# Patient Record
Sex: Female | Born: 1985 | Race: White | Hispanic: No | Marital: Married | Smoking: Never smoker
Health system: Southern US, Community
[De-identification: ages and names within clinical notes are randomized; demographics above are authoritative.]

## PROBLEM LIST (undated history)

## (undated) ENCOUNTER — Inpatient Hospital Stay: Payer: Self-pay

## (undated) ENCOUNTER — Ambulatory Visit: Admission: EM | Payer: Self-pay | Source: Home / Self Care

## (undated) ENCOUNTER — Ambulatory Visit: Admission: EM | Payer: Self-pay

## (undated) DIAGNOSIS — E041 Nontoxic single thyroid nodule: Secondary | ICD-10-CM

## (undated) DIAGNOSIS — K30 Functional dyspepsia: Secondary | ICD-10-CM

## (undated) DIAGNOSIS — Z9889 Other specified postprocedural states: Secondary | ICD-10-CM

## (undated) DIAGNOSIS — T8859XA Other complications of anesthesia, initial encounter: Secondary | ICD-10-CM

## (undated) DIAGNOSIS — F419 Anxiety disorder, unspecified: Secondary | ICD-10-CM

## (undated) DIAGNOSIS — D649 Anemia, unspecified: Secondary | ICD-10-CM

## (undated) DIAGNOSIS — T4145XA Adverse effect of unspecified anesthetic, initial encounter: Secondary | ICD-10-CM

---

## 2008-05-10 ENCOUNTER — Emergency Department: Payer: Self-pay | Admitting: Internal Medicine

## 2010-05-23 ENCOUNTER — Emergency Department: Payer: Self-pay | Admitting: Emergency Medicine

## 2010-06-03 IMAGING — CR DG CHEST 2V
1 series · 2 of 2 positions shown · non-contrast
Comparison: none

REASON FOR EXAM: cp
COMMENTS:

PROCEDURE:     DXR - DXR CHEST PA (OR AP) AND LATERAL  - May 10, 2008 [DATE]
RESULT:     The lung fields are clear. The heart, mediastinal and osseous
structures show no significant abnormalities.

[Series 1: view not recorded · 0.17mm/px · 2 of 2 slices shown]
[im 1/2]
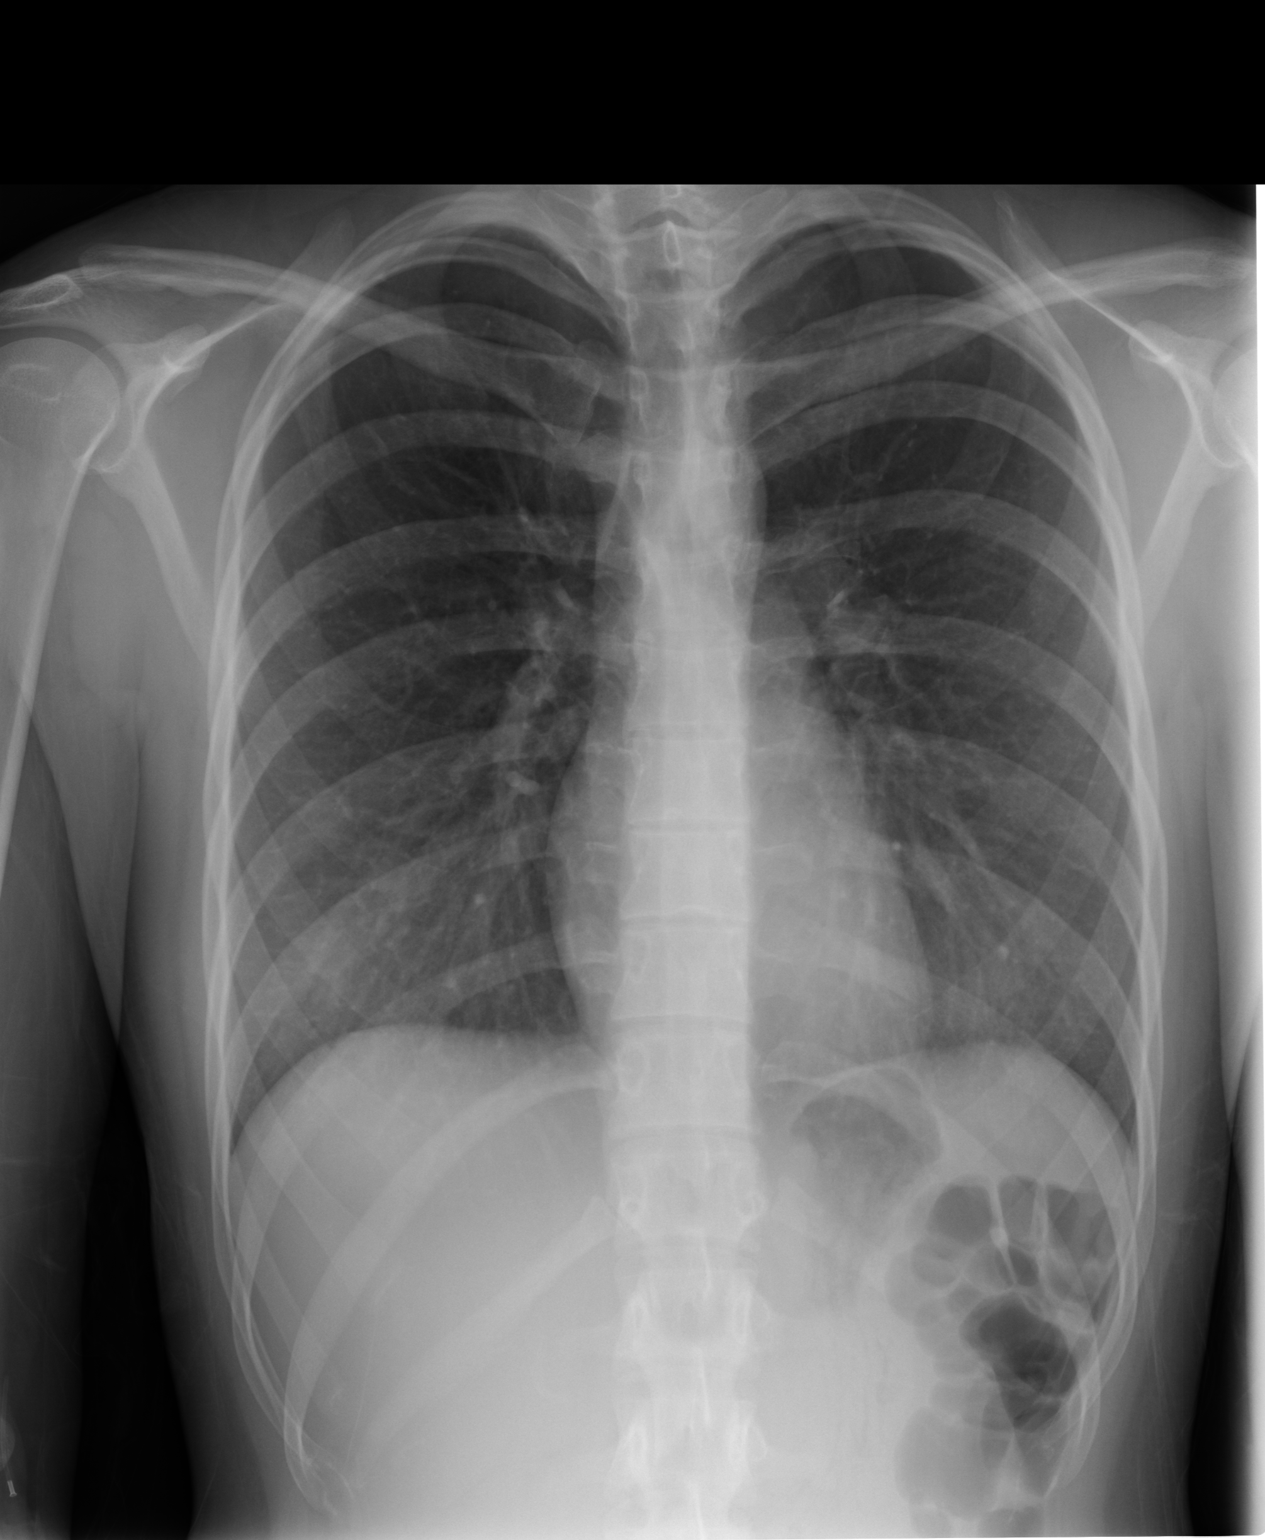
[im 2/2]
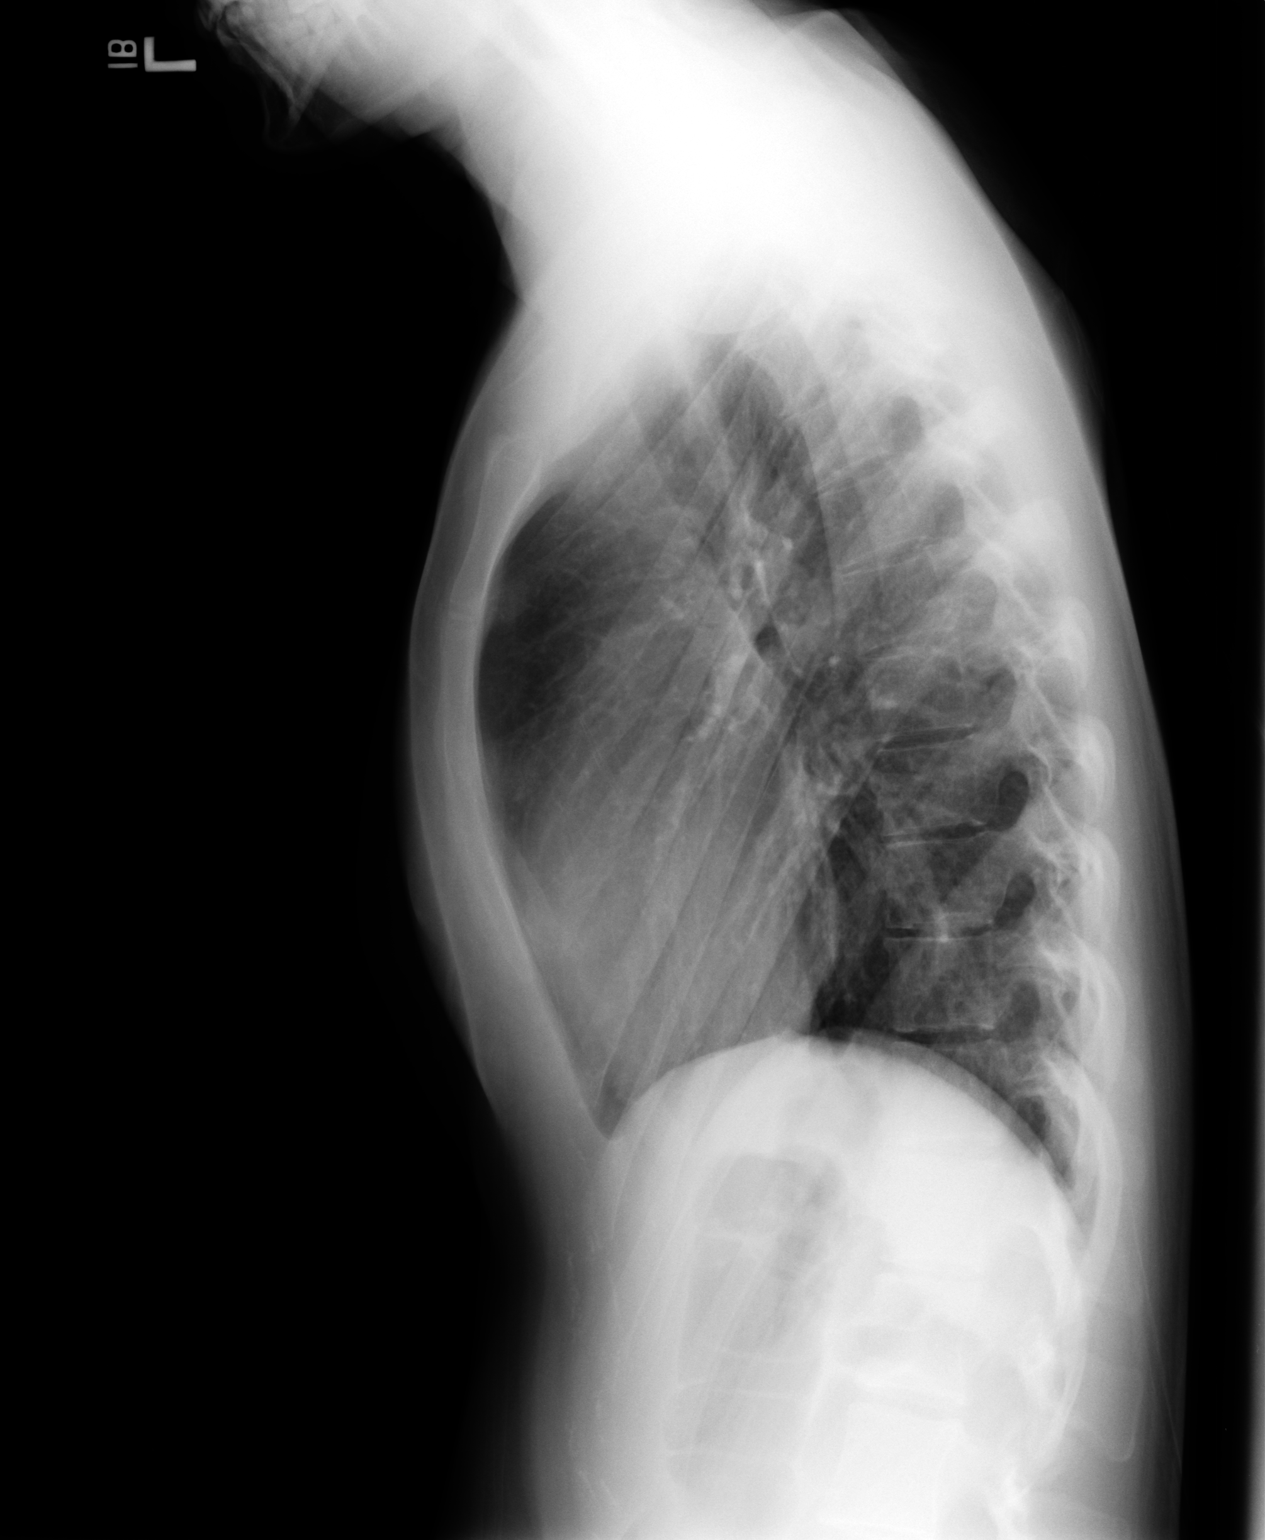

[2 of 2 positions shown; findings below may reference images not displayed]

IMPRESSION: 1.     No significant abnormalities are noted.

## 2010-11-30 ENCOUNTER — Inpatient Hospital Stay: Payer: Self-pay

## 2012-06-15 IMAGING — US US OB < 14 WEEKS
1 series · 16 of 16 positions shown · non-contrast
Comparison: none

REASON FOR EXAM: 13 week gestation with pelvic pain
COMMENTS:   LMP: > one month ago

[Series 1: us ob < 14 weeks · 16 of 16 slices shown]
[im 1/16]
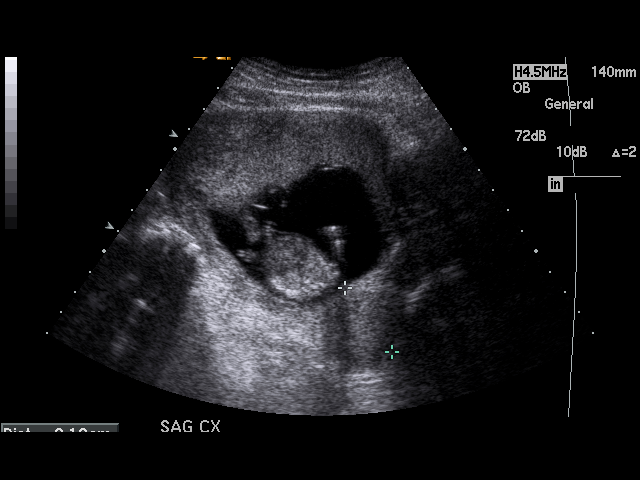
[im 2/16]
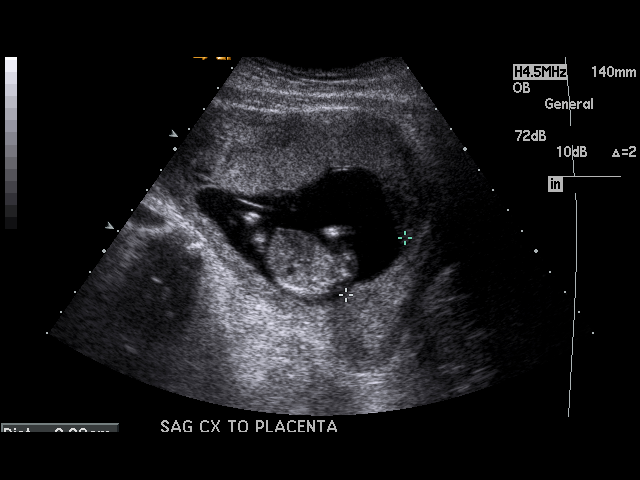
[im 3/16]
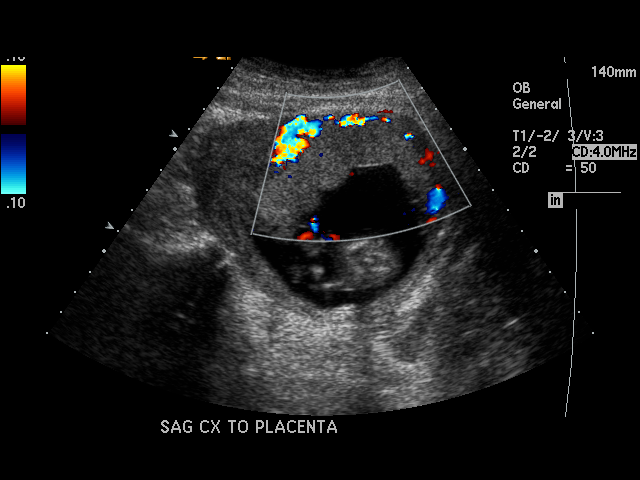
[im 4/16]
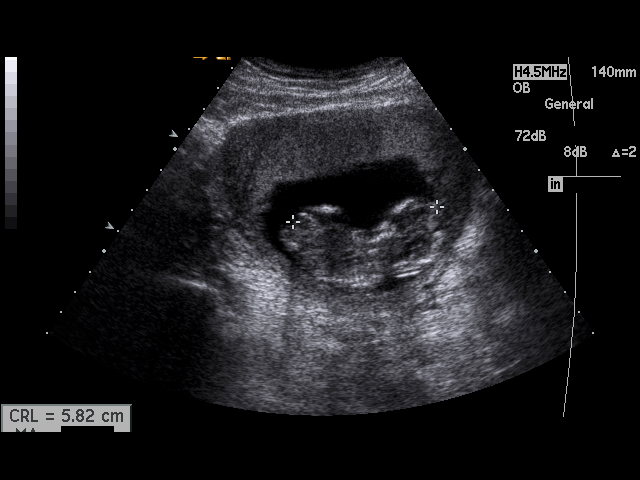
[im 5/16]
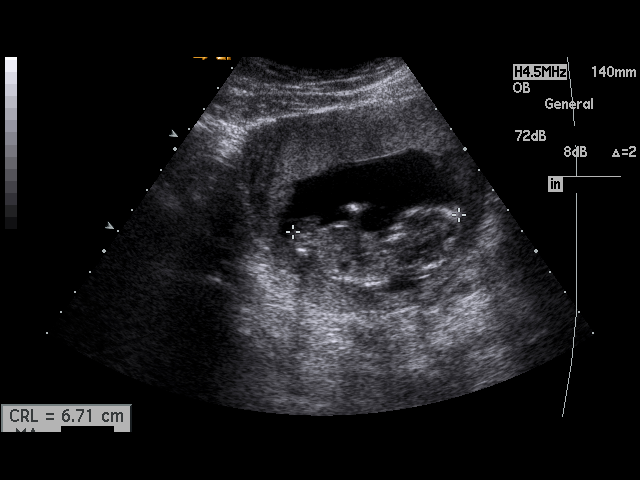
[im 6/16]
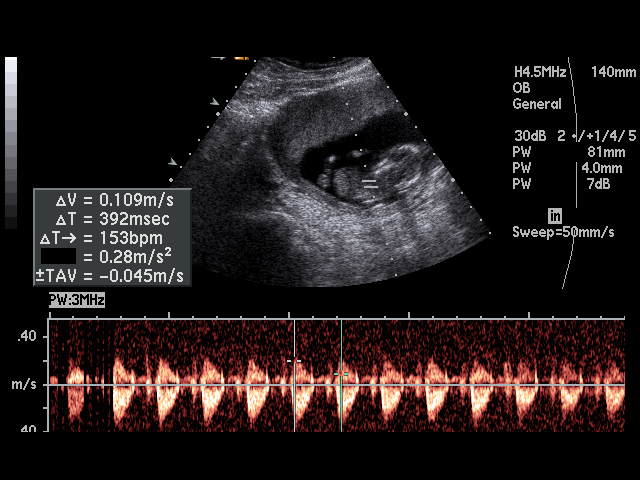
[im 7/16]
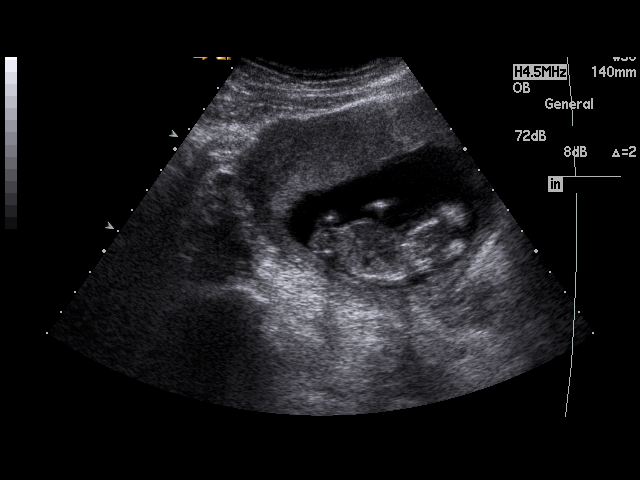
[im 8/16]
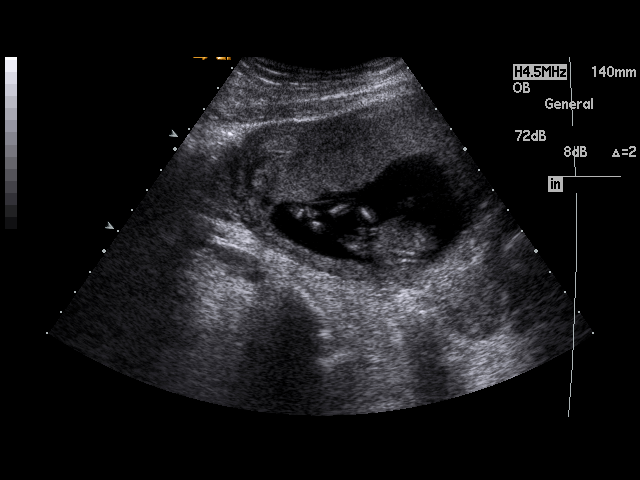
[im 9/16]
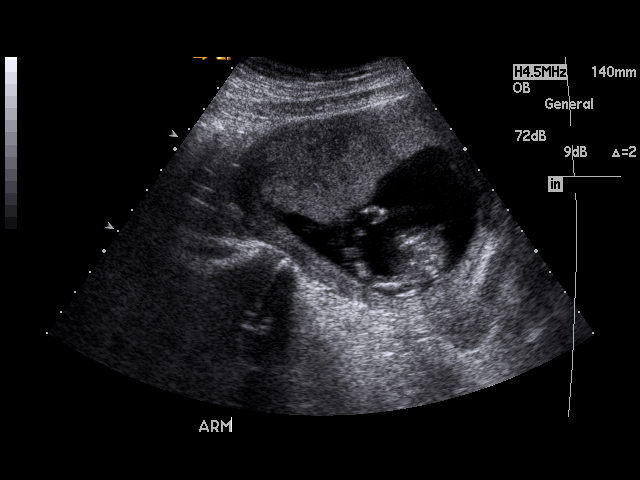
[im 10/16]
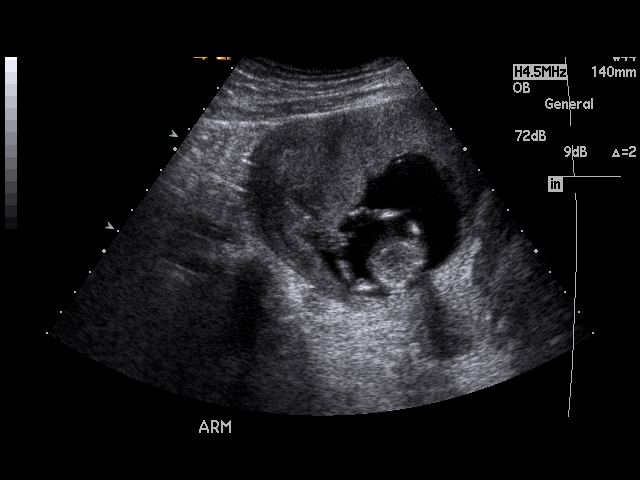
[im 11/16]
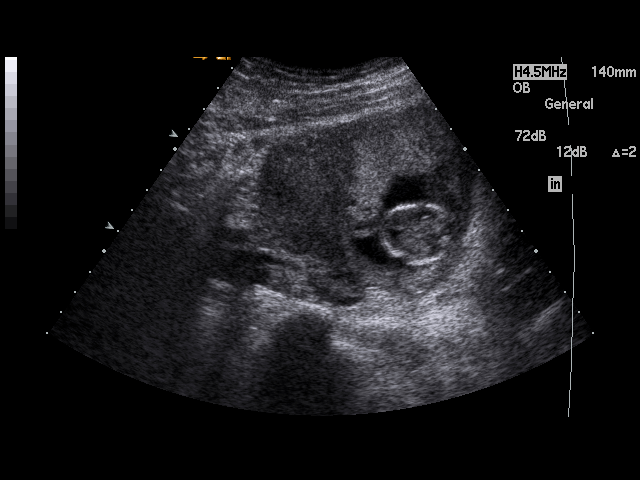
[im 12/16]
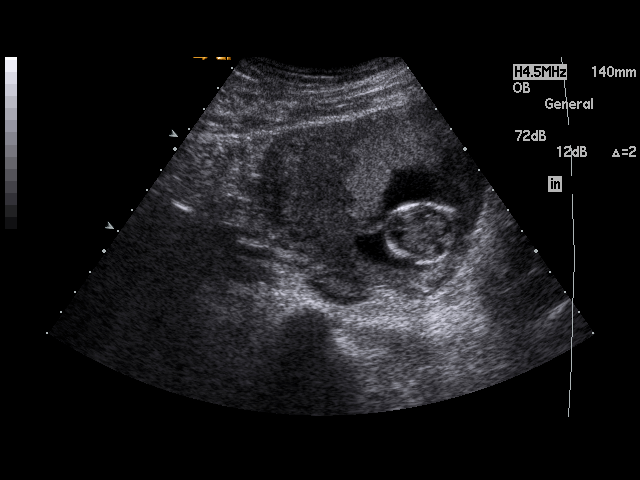
[im 13/16]
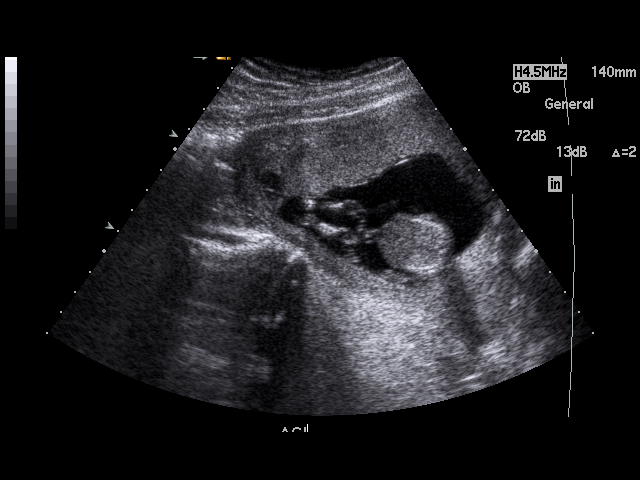
[im 14/16]
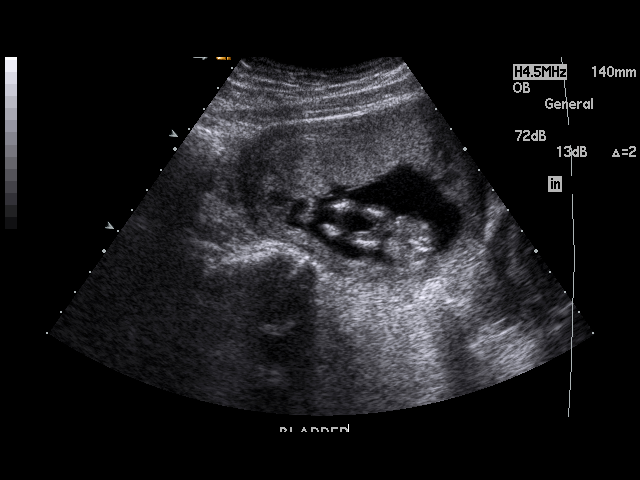
[im 15/16]
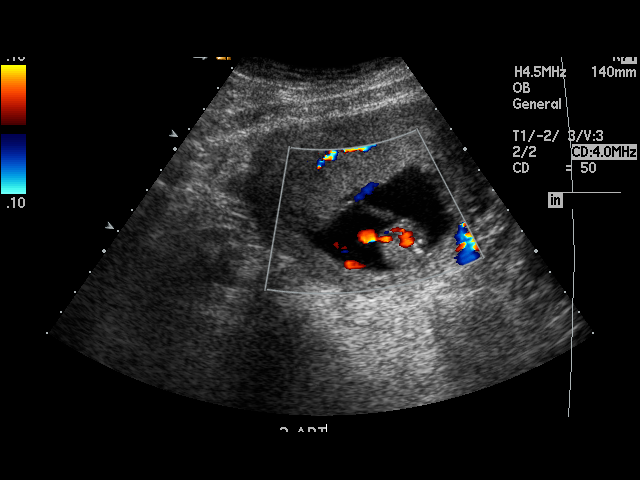
[im 16/16]
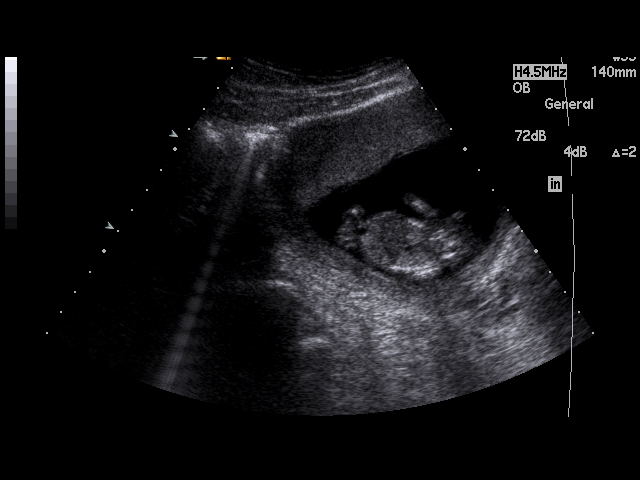

[16 of 16 positions shown; findings below may reference images not displayed]

PROCEDURE:     US  - US OB LESS THAN 14 WEEKS  - May 23, 2010  [DATE]

RESULT:     There is an IUP present. The crown-rump length measures 6.27 cm
corresponding to a 12 week 5 day gestation. Fetal cardiac activity with a
rate of 153 beats per minute is demonstrated. No subchorionic hemorrhage is
identified. The distance from the internal cervical os to the edge of the
placenta is approximately 3.3 cm. The cervix measures partially 3.2 cm in
length.
IMPRESSION: There is an early IUP which appears viable with a 12 week 5
day gestational age. The estimated date of confinement is 30 November, 2010. No
subchorionic hemorrhage is identified.

## 2014-08-15 LAB — OB RESULTS CONSOLE HEPATITIS B SURFACE ANTIGEN: HEP B S AG: NEGATIVE

## 2014-08-15 LAB — OB RESULTS CONSOLE RUBELLA ANTIBODY, IGM: Rubella: IMMUNE

## 2014-08-15 LAB — OB RESULTS CONSOLE VARICELLA ZOSTER ANTIBODY, IGG: Varicella: IMMUNE

## 2015-03-10 LAB — OB RESULTS CONSOLE GC/CHLAMYDIA
Chlamydia: NEGATIVE
Gonorrhea: NEGATIVE

## 2015-04-03 ENCOUNTER — Encounter
Admission: RE | Admit: 2015-04-03 | Discharge: 2015-04-03 | Disposition: A | Discharge status: Home / Self Care | Payer: Managed Care, Other (non HMO) | Source: Ambulatory Visit | Attending: Obstetrics and Gynecology | Admitting: Obstetrics and Gynecology

## 2015-04-03 HISTORY — DX: Anxiety disorder, unspecified: F41.9

## 2015-04-03 HISTORY — DX: Functional dyspepsia: K30

## 2015-04-03 HISTORY — DX: Adverse effect of unspecified anesthetic, initial encounter: T41.45XA

## 2015-04-03 HISTORY — DX: Other complications of anesthesia, initial encounter: T88.59XA

## 2015-04-03 LAB — DIFFERENTIAL
BASOS ABS: 0 10*3/uL (ref 0–0.1)
Basophils Relative: 0 %
EOS ABS: 0.2 10*3/uL (ref 0–0.7)
Eosinophils Relative: 2 %
LYMPHS ABS: 1.8 10*3/uL (ref 1.0–3.6)
Lymphocytes Relative: 22 %
Monocytes Absolute: 0.7 10*3/uL (ref 0.2–0.9)
Monocytes Relative: 8 %
NEUTROS ABS: 5.7 10*3/uL (ref 1.4–6.5)
NEUTROS PCT: 68 %

## 2015-04-03 LAB — TYPE AND SCREEN
ABO/RH(D): A POS
ANTIBODY SCREEN: NEGATIVE

## 2015-04-03 LAB — CBC
HCT: 36.1 % (ref 35.0–47.0)
HEMOGLOBIN: 11.8 g/dL — AB (ref 12.0–16.0)
MCH: 28.4 pg (ref 26.0–34.0)
MCHC: 32.8 g/dL (ref 32.0–36.0)
MCV: 86.5 fL (ref 80.0–100.0)
Platelets: 202 10*3/uL (ref 150–440)
RBC: 4.17 MIL/uL (ref 3.80–5.20)
RDW: 16.2 % — ABNORMAL HIGH (ref 11.5–14.5)
WBC: 8.4 10*3/uL (ref 3.6–11.0)

## 2015-04-03 LAB — ABO/RH: ABO/RH(D): A POS

## 2015-04-03 LAB — OB RESULTS CONSOLE HIV ANTIBODY (ROUTINE TESTING): HIV: NONREACTIVE

## 2015-04-03 NOTE — Patient Instructions (Signed)
Kimberly Hanna  Your procedure is scheduled on: 04/04/15 at 8:45 AM   Report to Emergency Room at 6:30 AM.             [facility]             [address]             [place], [place] [address]  Call this number if you have problems the morning of surgery: [phone]   Remember:   Do not eat food or drink liquids after midnight.     Do not wear jewelry, make-up or nail polish.  Do not wear lotions, powders, or perfumes. You may wear deodorant.  Do not shave prior to surgery.  Do not bring valuables to the hospital.  [facility] is not responsible for any belongings or valuables.                Contacts, dentures or bridgework may not be worn into surgery.  Leave suitcase in the car. After surgery it may be brought to your room.  For patients admitted to the hospital, discharge time is determined by your             treatment team.               Special Instructions: Preparing the skin before Cesarean Section              To help prevent the risk of infection at your surgical site, we are providing             you with rinse-free Sage 2% Chlorhexidine Gluconate (HCG) disposable             wipes.               The night before surgery:              1. Shower or bathe with warm water             2. Do not apply lotion or perfume             3. Wait one hour after shower, skin should be dry and cool             4. Open Sage wipe package - 2 disposable cloths are inside             5. Wipe the lower abdomen from the pubic line to the navel and hip bone to hip             bone with one cloth             6. Use the second cloth to wipe the front of the upper thighs             7. Allow the area to dry for one minute. DO NOT RINSE             8. Skin may feel "tacky" for several minutes             9. Dress in freshly laundered, clean clothes           10. Do not shower the morning of surgery    Please read over the following fact  sheets that you were given: Coughing and            Deep Breathing and Surgical Site Infection Prevention Cesarean Delivery  Cesarean delivery is the birth of a baby through a cut (incision) in the abdomen and womb (uterus).   LET [facility] CARE PROVIDER KNOW ABOUT:    All medicines you are taking, including vitamins, herbs, eye drops, creams, and over-the-counter medicines.  Previous problems you or members of your family have had with the use of anesthetics.  Any blood disorders you have.  Previous surgeries you have had.  Medical conditions you have.  Any allergies you have.  Complicationsinvolving the pregnancy.  RISKS AND COMPLICATIONS   Generally, this is a safe procedure. However, as with any procedure, complications can occur. Possible complications include:  Bleeding.  Infection.  Blood clots.  Injury to surrounding organs.  Problems with anesthesia.  Injury to the baby.  BEFORE THE PROCEDURE  1. You may be given an antacid medicine to drink. This will prevent acid contents in your stomach from going into your lungs if you vomit during the surgery. 2. You may be given an antibiotic medicine to prevent infection.  PROCEDURE   Hair may be removed from your pubic area and your lower abdomen. This is to prevent infection in the incision site.  A tube (Foley catheter) will be placed in your bladder to drain your urine from your bladder into a bag. This keeps your bladder empty during surgery.  An IV tube will be placed in your vein.  You may be given medicine to numb the lower half of your body (regional anesthetic). If you were in labor, you may have already had an epidural in place which can be used in both labor and cesarean delivery. You may possibly be given medicine to make you sleep (general anesthetic) though this is not as common.  An incision will be made in your abdomen that extends to your uterus. There are 2 basic kinds of incisions:  The  horizontal (transverse) incision. Horizontal incisions are from side to side and are used for most routine cesarean deliveries.  The vertical incision. The vertical incision is from the top of the abdomen to the bottom and is less commonly used. It is often done for women who have a serious complication (extreme prematurity) or under emergency situations.   Surgical Site Infections FAQs What is a Surgical Site Infection (SSI)? A surgical site infection is an infection that occurs after surgery in the part of the body where the surgery took place. Most patients who have surgery do not develop an infection. However, infections develop in about 1 to 3 out of every 100 patients who have surgery. Some of the common symptoms of a surgical site infection are:  Redness and pain around the area where you had surgery  Drainage of cloudy fluid from your surgical wound  Fever Can SSIs be treated? Yes. Most surgical site infections can be treated with antibiotics. The antibiotic given to you depends on the bacteria (germs) causing the infections. Sometimes patients with SSIs also need another surgery to treat the infection. What are some of the things that hospitals are doing to prevent SSIs? To prevent SSIs, doctors, nurses, and other healthcare providers:  Clean their hands and arms up to their elbows with an antiseptic agent just before the surgery.  Clean their hands with soap and water or an alcohol-based hand rub before and after caring for each patient.  May remove some of your hair immediately before your surgery using electric clippers if the hair is in the same area where the procedure will occur. They should not  shave you with a razor.  Wear special hair covers, masks, gowns, and gloves during surgery to keep the surgery area clean.  Give you antibiotics before your surgery starts. In most cases, you should get antibiotics within 60 minutes before the surgery starts and the antibiotics  should be stopped within 24 hours after surgery.  Clean the skin at the site of your surgery with a special soap that kills germs. What can I do to help prevent SSIs? Before your surgery: 3. Tell your doctor about other medical problems you may have. Health problems such as allergies, diabetes, and obesity could affect your surgery and your treatment. 4. Quit smoking. Patients who smoke get more infections. Talk to your doctor about how you can quit before your surgery. 5. Do not shave near where you will have surgery. Shaving with a razor can irritate your skin and make it easier to develop an infection. At the time of your surgery:  Speak up if someone tries to shave you with a razor before surgery. Ask why you need to be shaved and talk with your surgeon if you have any concerns.  Ask if you will get antibiotics before surgery. After your surgery:  Make sure that your healthcare providers clean their hands before examining you, either with soap and water or an alcohol-based hand rub.  If you do not see your providers clean their hands, please ask them to do so.  Family and friends who visit you should not touch the surgical wound or dressings.  Family and friends should clean their hands with soap and water or an alcohol-based hand rub before and after visiting you. If you do not see them clean their hands, ask them to clean their hands. What do I need to do when I go home from the hospital?  Before you go home, your doctor or nurse should explain everything you need to know about taking care of your wound. Make sure you understand how to care for your wound before you leave the hospital.  Always clean your hands before and after caring for your wound.  Before you go home, make sure you know who to contact if you have questions or problems after you get home.  If you have any symptoms of an infection, such as redness and pain at the surgery site, drainage, or fever, call your doctor  immediately. If you have additional questions, please ask your doctor or nurse. Developed and co-sponsored by [employer] for [employer] of [place] [phone]); Infectious Diseases Society of America (IDSA); [facility] Association; Association for Professionals in Infection Control and Epidemiology (APIC); Centers for Disease Control and Prevention (CDC); and [employer] Document Released: 06/15/2013 Document Reviewed: 06/15/2013 [facility] Patient Information 2015 [place], [place]. This information is not intended to replace advice given to you by your health care provider. Make sure you discuss any questions you have with your health care provider.   Incentive Spirometer An incentive spirometer is a tool that can help keep your lungs clear and active. This tool measures how well you are filling your lungs with each breath. Taking long, deep breaths may help reverse or decrease the chance of developing breathing (pulmonary) problems (especially infection) following:  Surgery of the chest or abdomen.  Surgery if you have a history of smoking or a lung problem.  A long period of time when you are unable to move or be active. BEFORE THE PROCEDURE   If the spirometer includes an indicator to show  your best effort, your nurse or respiratory therapist will set it to a desired goal.  If possible, sit up straight or lean slightly forward. Try not to slouch.  Hold the incentive spirometer in an upright position. INSTRUCTIONS FOR USE  6. Sit on the edge of your bed if possible, or sit up as far as you can in bed or on a chair. 7. Hold the incentive spirometer in an upright position. 8. Breathe out normally. 9. Place the mouthpiece in your mouth and seal your lips tightly around it. 10. Breathe in slowly and as deeply as possible, raising the piston or the ball toward the top of the column. 11. Hold your breath for 3-5 seconds or for as long as possible. Allow the piston or  ball to fall to the bottom of the column. 12. Remove the mouthpiece from your mouth and breathe out normally. 13. Rest for a few seconds and repeat Steps 1 through 7 at least 10 times every 1-2 hours when you are awake. Take your time and take a few normal breaths between deep breaths. 14. The spirometer may include an indicator to show your best effort. Use the indicator as a goal to work toward during each repetition. 15. After each set of 10 deep breaths, practice coughing to be sure your lungs are clear. If you have an incision (the cut made at the time of surgery), support your incision when coughing by placing a pillow or rolled-up towels firmly against it. Once you are able to get out of bed, walk around indoors and cough well. You may stop using the incentive spirometer when instructed by your caregiver.  RISKS AND COMPLICATIONS  Breathing too quickly may cause dizziness. At an extreme, this could cause you to pass out. Take your time so you do not get dizzy or light-headed.  If you are in pain, you may need to take or ask for pain medication before doing incentive spirometry. It is harder to take a deep breath if you are having pain. AFTER USE  Rest and breathe slowly and easily.  It can be helpful to keep a log of your progress. Your caregiver can provide you with a simple table to help with this. If you are using the spirometer at home, follow these instructions: SEEK MEDICAL CARE IF:   You are having difficultly using the spirometer.  You have trouble using the spirometer as often as instructed.  Your pain medication is not giving enough relief while using the spirometer.  You develop fever of 100.62F (38.1C) or higher. SEEK IMMEDIATE MEDICAL CARE IF:   You cough up bloody sputum that had not been present before.  You develop fever of 102F (38.9C) or greater.  You develop worsening pain at or near the incision site. MAKE SURE YOU:   Understand these  instructions.  Will watch your condition.  Will get help right away if you are not doing well or get worse. Document Released: 10/21/2006 Document Revised: 10/25/2013 Document Reviewed: 12/22/2006 [facility] Patient Information 2015 [place], [place]. This information is not intended to replace advice given to you by your health care provider. Make sure you discuss any questions you have with your health care provider.   The horizontal and vertical incisions may both be used at the same time. However, this is very uncommon.  An incision is then made in your uterus to deliver the baby.  Your baby will then be delivered.  Both incisions are then closed with absorbable stitches. AFTER  THE PROCEDURE   If you were awake during the surgery, you will see your baby right away. If you were asleep, you will see your baby as soon as you are awake.  You may breastfeed your baby after surgery.  You may be able to get up and walk the same day as the surgery. If you need to stay in bed for a period of time, you will receive help to turn, cough, and take deep breaths after surgery. This helps prevent lung problems such as pneumonia.  Do not get out of bed alone the first time after surgery. You will need help getting out of bed until you are able to do this by yourself.  You may be able to shower the day after your cesarean delivery. After the bandage (dressing) is taken off the incision site, a nurse will assist you to shower if you would like help.  You will have pneumatic compression hose placed on your lower legs. This is done to prevent blood clots. When you are up and walking regularly, they will no longer be necessary.  Do not cross your legs when you sit.  Save any blood clots that you pass. If you pass a clot while on the toilet, do not flush it. Call for the nurse. Tell the nurse if you think you are bleeding too much or passing too many clots.  You will be given medicine as needed. Let  your health care providers know if you are hurting. You may also be given an antibiotic to prevent an infection.  Your IV tube will be taken out when you are drinking a reasonable amount of fluids. The Foley catheter is taken out when you are up and walking.  If your blood type is Rh negative and your baby's blood type is Rh positive, you will be given a shot of anti-D immune globulin. This shot prevents you from having Rh problems with a future pregnancy. You should get the shot even if you had your tubes tied (tubal ligation).  If you are allowed to take the baby for a walk, place the baby in the bassinet and push it. Do not carry your baby in your arms. Document Released: 06/10/2005 Document Revised: 03/31/2013 Document Reviewed: 12/30/2012 [facility] Patient Information 2015 [place], [place]. This information is not intended to replace advice given to you by your health care provider. Make sure you discuss any questions you have with your health care provider.

## 2015-04-04 ENCOUNTER — Inpatient Hospital Stay: Payer: Managed Care, Other (non HMO) | Admitting: Anesthesiology

## 2015-04-04 ENCOUNTER — Encounter: Payer: Self-pay | Admitting: *Deleted

## 2015-04-04 ENCOUNTER — Encounter
Admission: RE | Disposition: A | Discharge status: Home / Self Care | Payer: Self-pay | Source: Ambulatory Visit | Attending: Obstetrics and Gynecology

## 2015-04-04 ENCOUNTER — Inpatient Hospital Stay
Admission: RE | Admit: 2015-04-04 | Discharge: 2015-04-06 | DRG: 765 | Disposition: A | Discharge status: Home / Self Care | Payer: Managed Care, Other (non HMO) | Source: Ambulatory Visit | Attending: Obstetrics and Gynecology | Admitting: Obstetrics and Gynecology

## 2015-04-04 DIAGNOSIS — Z98891 History of uterine scar from previous surgery: Secondary | ICD-10-CM

## 2015-04-04 DIAGNOSIS — Z8249 Family history of ischemic heart disease and other diseases of the circulatory system: Secondary | ICD-10-CM

## 2015-04-04 DIAGNOSIS — Z3A40 40 weeks gestation of pregnancy: Secondary | ICD-10-CM

## 2015-04-04 DIAGNOSIS — Z79899 Other long term (current) drug therapy: Secondary | ICD-10-CM | POA: Diagnosis present

## 2015-04-04 DIAGNOSIS — D62 Acute posthemorrhagic anemia: Secondary | ICD-10-CM | POA: Diagnosis present

## 2015-04-04 DIAGNOSIS — O34219 Maternal care for unspecified type scar from previous cesarean delivery: Principal | ICD-10-CM | POA: Diagnosis present

## 2015-04-04 DIAGNOSIS — Z806 Family history of leukemia: Secondary | ICD-10-CM | POA: Diagnosis not present

## 2015-04-04 LAB — HIV ANTIBODY (ROUTINE TESTING W REFLEX): HIV Screen 4th Generation wRfx: NONREACTIVE

## 2015-04-04 LAB — RPR: RPR: NONREACTIVE

## 2015-04-04 SURGERY — Surgical Case
Anesthesia: Spinal

## 2015-04-04 MED ORDER — NALBUPHINE HCL 10 MG/ML IJ SOLN
5.0000 mg | INTRAMUSCULAR | Status: DC | PRN
  Filled 2015-04-04: qty 0.5

## 2015-04-04 MED ORDER — LACTATED RINGERS IV SOLN
INTRAVENOUS | Status: DC
  Administered 2015-04-04: 22:00:00 via INTRAVENOUS

## 2015-04-04 MED ORDER — LACTATED RINGERS IV SOLN: INTRAVENOUS | Status: DC

## 2015-04-04 MED ORDER — CEFAZOLIN SODIUM-DEXTROSE 2-3 GM-% IV SOLR
2.0000 g | INTRAVENOUS | Status: AC
  Administered 2015-04-04: 2 g via INTRAVENOUS

## 2015-04-04 MED ORDER — IBUPROFEN 600 MG PO TABS
600.0000 mg | ORAL_TABLET | Freq: Four times a day (QID) | ORAL | Status: DC
  Administered 2015-04-04 – 2015-04-06 (×8): 600 mg via ORAL
  Filled 2015-04-04 (×6): qty 1

## 2015-04-04 MED ORDER — CITRIC ACID-SODIUM CITRATE 334-500 MG/5ML PO SOLN
30.0000 mL | ORAL | Status: AC
  Administered 2015-04-04: 30 mL via ORAL
  Filled 2015-04-04: qty 30

## 2015-04-04 MED ORDER — IBUPROFEN 600 MG PO TABS: 600.0000 mg | ORAL_TABLET | Freq: Four times a day (QID) | ORAL | Status: DC | PRN

## 2015-04-04 MED ORDER — DIPHENHYDRAMINE HCL 50 MG/ML IJ SOLN: 12.5000 mg | INTRAMUSCULAR | Status: DC | PRN

## 2015-04-04 MED ORDER — OXYTOCIN 40 UNITS IN LACTATED RINGERS INFUSION - SIMPLE MED
INTRAVENOUS | Status: DC | PRN
  Administered 2015-04-04: 700 mL via INTRAVENOUS

## 2015-04-04 MED ORDER — SODIUM CHLORIDE 0.9 % IJ SOLN: 3.0000 mL | INTRAMUSCULAR | Status: DC | PRN

## 2015-04-04 MED ORDER — ONDANSETRON HCL 4 MG/2ML IJ SOLN: 4.0000 mg | Freq: Once | INTRAMUSCULAR | Status: DC | PRN

## 2015-04-04 MED ORDER — SIMETHICONE 80 MG PO CHEW: 80.0000 mg | CHEWABLE_TABLET | ORAL | Status: DC

## 2015-04-04 MED ORDER — MEPERIDINE HCL 25 MG/ML IJ SOLN: 6.2500 mg | INTRAMUSCULAR | Status: DC | PRN

## 2015-04-04 MED ORDER — SCOPOLAMINE 1 MG/3DAYS TD PT72: 1.0000 | MEDICATED_PATCH | Freq: Once | TRANSDERMAL | Status: DC

## 2015-04-04 MED ORDER — LANOLIN HYDROUS EX OINT: 1.0000 "application " | TOPICAL_OINTMENT | CUTANEOUS | Status: DC | PRN

## 2015-04-04 MED ORDER — LACTATED RINGERS IV SOLN
INTRAVENOUS | Status: DC
  Administered 2015-04-04: 08:00:00 via INTRAVENOUS

## 2015-04-04 MED ORDER — MENTHOL 3 MG MT LOZG: 1.0000 | LOZENGE | OROMUCOSAL | Status: DC | PRN

## 2015-04-04 MED ORDER — DIPHENHYDRAMINE HCL 25 MG PO CAPS: 25.0000 mg | ORAL_CAPSULE | Freq: Four times a day (QID) | ORAL | Status: DC | PRN

## 2015-04-04 MED ORDER — PHENYLEPHRINE HCL 10 MG/ML IJ SOLN
INTRAMUSCULAR | Status: DC | PRN
  Administered 2015-04-04: 100 ug via INTRAVENOUS
  Administered 2015-04-04: 200 ug via INTRAVENOUS

## 2015-04-04 MED ORDER — BUPIVACAINE HCL 0.5 % IJ SOLN
INTRAMUSCULAR | Status: DC | PRN
  Administered 2015-04-04: 10 mL

## 2015-04-04 MED ORDER — SENNOSIDES-DOCUSATE SODIUM 8.6-50 MG PO TABS
2.0000 | ORAL_TABLET | ORAL | Status: DC
  Administered 2015-04-05 – 2015-04-06 (×2): 2 via ORAL
  Filled 2015-04-04 (×2): qty 2

## 2015-04-04 MED ORDER — CEFAZOLIN SODIUM-DEXTROSE 2-3 GM-% IV SOLR
INTRAVENOUS | Status: AC
  Administered 2015-04-04: 2 g via INTRAVENOUS
  Filled 2015-04-04: qty 50

## 2015-04-04 MED ORDER — CHLORHEXIDINE GLUCONATE CLOTH 2 % EX PADS
2.0000 | MEDICATED_PAD | Freq: Every day | CUTANEOUS | Status: DC
  Administered 2015-04-04: 2 via TOPICAL

## 2015-04-04 MED ORDER — SIMETHICONE 80 MG PO CHEW
80.0000 mg | CHEWABLE_TABLET | ORAL | Status: DC | PRN
  Filled 2015-04-04: qty 1

## 2015-04-04 MED ORDER — EPHEDRINE SULFATE 50 MG/ML IJ SOLN
INTRAMUSCULAR | Status: DC | PRN
  Administered 2015-04-04: 10 mg via INTRAVENOUS

## 2015-04-04 MED ORDER — OXYTOCIN 40 UNITS IN LACTATED RINGERS INFUSION - SIMPLE MED: 62.5000 mL/h | INTRAVENOUS | Status: AC

## 2015-04-04 MED ORDER — WITCH HAZEL-GLYCERIN EX PADS: 1.0000 "application " | MEDICATED_PAD | CUTANEOUS | Status: DC | PRN

## 2015-04-04 MED ORDER — NALBUPHINE HCL 10 MG/ML IJ SOLN
5.0000 mg | Freq: Once | INTRAMUSCULAR | Status: DC | PRN
  Filled 2015-04-04: qty 0.5

## 2015-04-04 MED ORDER — MORPHINE SULFATE (PF) 0.5 MG/ML IJ SOLN
INTRAMUSCULAR | Status: DC | PRN
  Administered 2015-04-04: 1 mg via EPIDURAL
  Administered 2015-04-04: .2 mg via EPIDURAL

## 2015-04-04 MED ORDER — ACETAMINOPHEN 325 MG PO TABS: 650.0000 mg | ORAL_TABLET | ORAL | Status: DC | PRN

## 2015-04-04 MED ORDER — BUPIVACAINE 0.25 % ON-Q PUMP DUAL CATH 400 ML
400.0000 mL | INJECTION | Status: DC
Start: 2015-04-04 — End: 2015-04-06
  Filled 2015-04-04: qty 400

## 2015-04-04 MED ORDER — ONDANSETRON HCL 4 MG/2ML IJ SOLN: 4.0000 mg | Freq: Three times a day (TID) | INTRAMUSCULAR | Status: DC | PRN

## 2015-04-04 MED ORDER — SIMETHICONE 80 MG PO CHEW
80.0000 mg | CHEWABLE_TABLET | Freq: Three times a day (TID) | ORAL | Status: DC
  Administered 2015-04-04 – 2015-04-06 (×4): 80 mg via ORAL
  Filled 2015-04-04 (×3): qty 1

## 2015-04-04 MED ORDER — DIPHENHYDRAMINE HCL 25 MG PO CAPS: 25.0000 mg | ORAL_CAPSULE | ORAL | Status: DC | PRN

## 2015-04-04 MED ORDER — PRENATAL MULTIVITAMIN CH
1.0000 | ORAL_TABLET | Freq: Every day | ORAL | Status: DC
  Administered 2015-04-05 – 2015-04-06 (×2): 1 via ORAL
  Filled 2015-04-04 (×2): qty 1

## 2015-04-04 MED ORDER — DIBUCAINE 1 % RE OINT: 1.0000 "application " | TOPICAL_OINTMENT | RECTAL | Status: DC | PRN

## 2015-04-04 MED ORDER — NALOXONE HCL 0.4 MG/ML IJ SOLN
0.4000 mg | INTRAMUSCULAR | Status: DC | PRN
Start: 2015-04-04 — End: 2015-04-05

## 2015-04-04 MED ORDER — OXYTOCIN 40 UNITS IN LACTATED RINGERS INFUSION - SIMPLE MED
INTRAVENOUS | Status: AC
  Administered 2015-04-04: 40 [IU]
  Filled 2015-04-04: qty 1000

## 2015-04-04 MED ORDER — NALOXONE HCL 1 MG/ML IJ SOLN: 1.0000 ug/kg/h | INTRAVENOUS | Status: DC | PRN

## 2015-04-04 MED ORDER — ONDANSETRON HCL 4 MG/2ML IJ SOLN
INTRAMUSCULAR | Status: DC | PRN
  Administered 2015-04-04: 4 mg via INTRAVENOUS

## 2015-04-04 MED ORDER — BUPIVACAINE IN DEXTROSE 0.75-8.25 % IT SOLN
INTRATHECAL | Status: DC | PRN
  Administered 2015-04-04: 1.6 mL via INTRATHECAL

## 2015-04-04 MED ORDER — BUPIVACAINE HCL (PF) 0.5 % IJ SOLN
10.0000 mL | Freq: Once | INTRAMUSCULAR | Status: DC
  Filled 2015-04-04: qty 30

## 2015-04-04 SURGICAL SUPPLY — 28 items
BAG COUNTER SPONGE EZ (MISCELLANEOUS) ×2 IMPLANT
CANISTER SUCT 3000ML (MISCELLANEOUS) ×3 IMPLANT
CATH KIT ON-Q SILVERSOAK 5IN (CATHETERS) ×6 IMPLANT
CHLORAPREP W/TINT 26ML (MISCELLANEOUS) ×6 IMPLANT
CLOSURE WOUND 1/2 X4 (GAUZE/BANDAGES/DRESSINGS) ×1
COUNTER SPONGE BAG EZ (MISCELLANEOUS) ×1
DRSG TELFA 3X8 NADH (GAUZE/BANDAGES/DRESSINGS) ×3 IMPLANT
ELECT CAUTERY BLADE 6.4 (BLADE) ×3 IMPLANT
GAUZE SPONGE 4X4 12PLY STRL (GAUZE/BANDAGES/DRESSINGS) ×3 IMPLANT
GLOVE BIO SURGEON STRL SZ7 (GLOVE) ×15 IMPLANT
GLOVE INDICATOR 7.5 STRL GRN (GLOVE) ×15 IMPLANT
GOWN STRL REUS W/ TWL LRG LVL3 (GOWN DISPOSABLE) ×5 IMPLANT
GOWN STRL REUS W/TWL LRG LVL3 (GOWN DISPOSABLE) ×10
LIQUID BAND (GAUZE/BANDAGES/DRESSINGS) ×3 IMPLANT
NS IRRIG 1000ML POUR BTL (IV SOLUTION) ×3 IMPLANT
PACK C SECTION AR (MISCELLANEOUS) ×3 IMPLANT
PAD GROUND ADULT SPLIT (MISCELLANEOUS) ×3 IMPLANT
PAD OB MATERNITY 4.3X12.25 (PERSONAL CARE ITEMS) ×3 IMPLANT
PAD PREP 24X41 OB/GYN DISP (PERSONAL CARE ITEMS) ×3 IMPLANT
SPONGE LAP 18X18 5 PK (GAUZE/BANDAGES/DRESSINGS) ×3 IMPLANT
STRIP CLOSURE SKIN 1/2X4 (GAUZE/BANDAGES/DRESSINGS) ×2 IMPLANT
SUT MNCRL AB 4-0 PS2 18 (SUTURE) ×3 IMPLANT
SUT PDS AB 1 TP1 96 (SUTURE) ×3 IMPLANT
SUT VIC AB 0 CTX 36 (SUTURE) ×4
SUT VIC AB 0 CTX36XBRD ANBCTRL (SUTURE) ×2 IMPLANT
SUT VIC AB 2-0 CT1 27 (SUTURE) ×2
SUT VIC AB 2-0 CT1 36 (SUTURE) ×3 IMPLANT
SUT VIC AB 2-0 CT1 TAPERPNT 27 (SUTURE) ×1 IMPLANT

## 2015-04-04 NOTE — Progress Notes (Signed)
After starting IV, 2nd attempt, pt. Lightheaded, feeling faint from 1st attempt of IV start. 2nd attempt in right forearm. Pt. Fainted, head of bed lowered, IV fluid increased, pt. Alert and talking with interventions. Fetal heart rate decreased to 70s with gradual return to baseline.  Pt. Stable.

## 2015-04-04 NOTE — Anesthesia Preprocedure Evaluation (Signed)
Anesthesia Evaluation  Patient identified by MRN, date of birth, ID band Patient awake    Reviewed: Allergy & Precautions, NPO status , Patient's Chart, lab work & pertinent test results  History of Anesthesia Complications Negative for: history of anesthetic complications  Airway Mallampati: II       Dental  (+) Teeth Intact   Pulmonary neg pulmonary ROS,           Cardiovascular      Neuro/Psych negative neurological ROS     GI/Hepatic Neg liver ROS, GERD  Medicated and Controlled,  Endo/Other  negative endocrine ROS  Renal/GU negative Renal ROS     Musculoskeletal   Abdominal   Peds  Hematology   Anesthesia Other Findings   Reproductive/Obstetrics                             Anesthesia Physical Anesthesia Plan  ASA: II  Anesthesia Plan: Spinal   Post-op Pain Management:    Induction:   Airway Management Planned: Nasal Cannula  Additional Equipment:   Intra-op Plan:   Post-operative Plan:   Informed Consent: I have reviewed the patients History and Physical, chart, labs and discussed the procedure including the risks, benefits and alternatives for the proposed anesthesia with the patient or authorized representative who has indicated his/her understanding and acceptance.     Plan Discussed with:   Anesthesia Plan Comments:         Anesthesia Quick Evaluation

## 2015-04-04 NOTE — Progress Notes (Signed)
This note also relates to the following rows which could not be included: SpO2 - Cannot attach notes to unvalidated device data   Peri care given, clean pad put in place.

## 2015-04-04 NOTE — Transfer of Care (Signed)
Immediate Anesthesia Transfer of Care Note  Patient: Kimberly Hanna  Procedure(s) Performed: Procedure(s): CESAREAN SECTION (N/A)  Patient Location: Mother/Baby  Anesthesia Type:Spinal  Level of Consciousness: awake, alert , oriented and patient cooperative  Airway & Oxygen Therapy: Patient Spontanous Breathing  Post-op Assessment: Report given to RN, Post -op Vital signs reviewed and stable and Patient moving all extremities X 4  Post vital signs: Reviewed and stable  Last Vitals:  Filed Vitals:   04/04/15 1027  BP: 105/89  Pulse: 94  Temp: 36.7 C  Resp: 16    Complications: No apparent anesthesia complications

## 2015-04-04 NOTE — Anesthesia Procedure Notes (Signed)
Spinal Patient location during procedure: OR Start time: 04/04/2015 8:57 AM End time: 04/04/2015 9:07 AM Staffing Performed by: anesthesiologist  Preanesthetic Checklist Completed: patient identified, site marked, surgical consent, pre-op evaluation, timeout performed, IV checked, risks and benefits discussed and monitors and equipment checked Spinal Block Patient position: sitting Prep: Betadine Patient monitoring: heart rate, continuous pulse ox, blood pressure and cardiac monitor Approach: midline Location: L4-5 Injection technique: single-shot Needle Needle type: Whitacre and Introducer  Needle gauge: 24 G Needle length: 9 cm Additional Notes Negative paresthesia. Negative blood return. Positive free-flowing CSF. Expiration date of kit checked and confirmed. Patient tolerated procedure well, without complications.

## 2015-04-04 NOTE — Op Note (Signed)
Preoperative Diagnosis: 1) 29 y.o. G2P2002 at [redacted]w[redacted]d 2) [id number] prior Cesarean section desires repeat  Postoperative Diagnosis: 1) 29 y.o. [id number] at [redacted]w[redacted]d 2) History of prior Cesarean section desires repeat  Operation Performed: Repeat low transverse C-section via pfannenstiel skin incision  Indiciation: Prior Cesarean  Anesthesia: Spinal  Primary Surgeon: Vena Austria, MD   Assistant:Charlie Vergie Living, MD  Preoperative Antibiotics: 2g ancef  Estimated Blood Loss:  IV Fluids:  Urine Output::  Drains or Tubes: Foley to gravity drainage, ON-Q catheter system  Implants: none  Specimens Removed: none  Complications: none  Intraoperative Findings:  Normal tubes ovaries and uterus.  Delivery resulted in the birth of a liveborn female APGAR (1 MIN): 8   APGAR (5 MINS): 9   weight 8lbs 13oz  Patient Condition:stable  Procedure in Detail:  Patient was taken to the operating room were she was administered regional anesthesia.  She was positioned in the supine position, prepped and draped in the  Usual sterile fashion.  Prior to proceeding with the case a time out was performed and the level of anesthetic was checked and noted to be adequate.  Utilizing the scalpel a pfannenstiel skin incision was made 2cm above the pubic symphysis utilizing the patient's pre-existing scar and carried down sharply to the the level of the rectus fascia.  The fascia was incised in the midline using the scalpel and then extended using mayo scissors.  The superior border of the rectus fascia was grasped with two Kocher clamps and the underlying rectus muscles were dissected of the fascia using blunt dissection.  The median raphae was incised using Mayo scissors.   The inferior border of the rectus fascia was dissected of the rectus muscles in a similar fashion.  The midline was identified, the peritoneum was entered bluntly and expanded using manual tractions.  The uterus was noted  to be in a none rotated position.  Next the bladder blade was placed retracting the bladder caudally.  A bladder flap was not created.  A low transverse incision was scored on the lower uterine segment.  The hysterotomy was entered bluntly using the operators finger.  The hysterotomy incision was extended using manual traction.  The operators hand was placed within the hysterotomy position noting the fetus to be within the OA position.  The vertex was grasped, flexed, brought to the incision, and delivered a traumatically using fundal pressure.  The remainder of the body delivered with ease.  The infant was suctioned, cord was clamped and cut before handing off to the awaiting neonatologist.  The placenta was delivered using manual extraction.  The uterus was exteriorized, wiped clean of clots and debris using two moist laps.  The hysterotomy was closed using a two layer closure of 0 Vicryl, with the first being a running locked, the second a vertical imbricating.  The uterus was returned to the abdomen.  The peritoneal gutters were wiped clean of clots and debris using two moist laps.  The hysterotomy incision was re-inspected noted to be hemostatic.  The rectus muscles were re-approximated in the midline using a single 2-0 Vicryl mattress stitch.  The rectus muscles were inspected noted to be hemostatic.  The superior border of the rectus fascia was grasped with a Kocher clamp.  The ON-Q trocars were then placed 4cm above the superior border of the incision and tunneled subfascially.  The introducers were removed and the catheters were threaded through the sleeves after which the sleeves were removed.  The  fascia was closed using a looped #1 PDS in a running fashion taking 1cm by 1cm bites.  The subcutaneous tissue was irrigated using warm saline, hemostasis achieved using the bovis.  The subcutaneous dead space was greater 3cm and was closed using interupted throws of 2-0 Vicryl.  The skin was closed using 4-0  Monocryl.  Sponge needle and instrument counts were corrects times two.  The patient tolerated the procedure well and was taken to the recovery room in stable condition.

## 2015-04-04 NOTE — H&P (Signed)
Date of Initial paper &P: 04/03/15  History reviewed, patient examined, no change in status, stable for surgery.

## 2015-04-05 LAB — CBC
HCT: 25.5 % — ABNORMAL LOW (ref 35.0–47.0)
Hemoglobin: 8.3 g/dL — ABNORMAL LOW (ref 12.0–16.0)
MCH: 28.1 pg (ref 26.0–34.0)
MCHC: 32.7 g/dL (ref 32.0–36.0)
MCV: 86 fL (ref 80.0–100.0)
PLATELETS: 186 10*3/uL (ref 150–440)
RBC: 2.97 MIL/uL — AB (ref 3.80–5.20)
RDW: 16.7 % — AB (ref 11.5–14.5)
WBC: 12.9 10*3/uL — AB (ref 3.6–11.0)

## 2015-04-05 MED ORDER — WHITE PETROLATUM GEL
Status: AC
  Filled 2015-04-05: qty 10

## 2015-04-05 MED ORDER — OXYCODONE-ACETAMINOPHEN 5-325 MG PO TABS
1.0000 | ORAL_TABLET | Freq: Four times a day (QID) | ORAL | Status: DC | PRN
  Administered 2015-04-05: 2 via ORAL
  Filled 2015-04-05: qty 2

## 2015-04-05 MED ORDER — IBUPROFEN 600 MG PO TABS
600.0000 mg | ORAL_TABLET | Freq: Four times a day (QID) | ORAL | Status: DC | PRN
  Administered 2015-04-05: 600 mg via ORAL
  Filled 2015-04-05 (×2): qty 1

## 2015-04-05 NOTE — Anesthesia Postprocedure Evaluation (Signed)
  Anesthesia Post-op Note  Patient: [name]  Procedure(s) Performed: Procedure(s): CESAREAN SECTION (N/A)  Anesthesia type:Spinal  Patient location: 335  Post pain: Pain level controlled  Post assessment: Post-op Vital signs reviewed, Patient's Cardiovascular Status Stable, Respiratory Function Stable, Patent Airway and No signs of Nausea or vomiting  Post vital signs: Reviewed and stable  Last Vitals:  Filed Vitals:   04/05/15 0725  BP: 117/66  Pulse: 101  Temp: 36.9 C  Resp: 18    Level of consciousness: awake, alert  and patient cooperative  Complications: No apparent anesthesia complications

## 2015-04-05 NOTE — Anesthesia Post-op Follow-up Note (Signed)
  Anesthesia Pain Follow-up Note  Patient: Kimberly HalfKathryn K Hanna  Day #: 1  Date of Follow-up: 04/05/2015 Time: 10:29 AM  Last Vitals:  Filed Vitals:   04/05/15 0725  BP: 117/66  Pulse: 101  Temp: 36.9 C  Resp: 18    Level of Consciousness: alert  Pain: 0 /10   Side Effects:None  Catheter Site Exam:clean, dry, no drainage  Plan: D/C from anesthesia care  Starling Mannsurtis,  Devonne Kitchen A

## 2015-04-05 NOTE — Progress Notes (Signed)
  Subjective:  Doing well no concerns.  Moderate lochia.  No fevers no chills.  Tolerating po without N/V.  Adequate anelgesia   Objective:  Blood pressure 117/66, pulse 101, temperature 98.5 F (36.9 C), temperature source Oral, resp. rate 18, height 5\' 6"  (1.676 m), weight 76.204 kg (168 lb), last menstrual period 06/24/2014, SpO2 98 %, unknown if currently breastfeeding.  General: NAD Pulmonary: no increased work of breathing Abdomen: non-distended, non-tender, fundus firm at level of umbilicus Incision: Extremities: no edema, no erythema, no tenderness  Results for orders placed or performed during the hospital encounter of 04/04/15 (from the past 72 hour(s))  OB RESULTS CONSOLE HIV antibody     Status: None   Collection Time: 04/03/15 12:00 AM  Result Value Ref Range   HIV Non-reactive   CBC     Status: Abnormal   Collection Time: 04/05/15  6:19 AM  Result Value Ref Range   WBC 12.9 (H) 3.6 - 11.0 K/uL   RBC 2.97 (L) 3.80 - 5.20 MIL/uL   Hemoglobin 8.3 (L) 12.0 - 16.0 g/dL   HCT 21.325.5 (L) 08.635.0 - 57.847.0 %   MCV 86.0 80.0 - 100.0 fL   MCH 28.1 26.0 - 34.0 pg   MCHC 32.7 32.0 - 36.0 g/dL   RDW 46.916.7 (H) 62.911.5 - 52.814.5 %   Platelets 186 150 - 440 K/uL     Assessment:   29 y.o. [id number] postoperativeday # 1 RLTCS   Plan:  1) Acute blood loss anemia - hemodynamically stable and asymptomatic - po ferrous sulfate  2) A post / RI / VZI  3) TDAP status 01/25/15, offer influenza vaccination at discharge  4) Breast/undecided  5) Disposition - discharge POD2-3

## 2015-04-06 MED ORDER — LANOLIN HYDROUS EX OINT: 1.0000 "application " | TOPICAL_OINTMENT | CUTANEOUS | Status: DC | PRN

## 2015-04-06 MED ORDER — IBUPROFEN 600 MG PO TABS: 600.0000 mg | ORAL_TABLET | Freq: Four times a day (QID) | ORAL | Status: DC | PRN

## 2015-04-06 MED ORDER — NORETHINDRONE 0.35 MG PO TABS: 1.0000 | ORAL_TABLET | Freq: Every day | ORAL | Status: DC

## 2015-04-06 MED ORDER — WHITE PETROLATUM GEL
Status: DC
Start: 2015-04-06 — End: 2015-04-06
  Filled 2015-04-06: qty 5

## 2015-04-06 MED ORDER — FERROUS SULFATE 325 (65 FE) MG PO TABS
325.0000 mg | ORAL_TABLET | Freq: Every day | ORAL | Status: DC
  Administered 2015-04-06: 325 mg via ORAL
  Filled 2015-04-06: qty 1

## 2015-04-06 MED ORDER — DOCUSATE SODIUM 100 MG PO CAPS: 100.0000 mg | ORAL_CAPSULE | Freq: Two times a day (BID) | ORAL | Status: DC | PRN

## 2015-04-06 MED ORDER — DOCUSATE SODIUM 100 MG PO CAPS
100.0000 mg | ORAL_CAPSULE | Freq: Every day | ORAL | Status: DC
  Administered 2015-04-06: 100 mg via ORAL
  Filled 2015-04-06: qty 1

## 2015-04-06 MED ORDER — OXYCODONE-ACETAMINOPHEN 5-325 MG PO TABS: 1.0000 | ORAL_TABLET | Freq: Four times a day (QID) | ORAL | Status: DC | PRN

## 2015-04-06 NOTE — Lactation Note (Signed)
This note was copied from the chart of Boy Jorge NyKathryn Minotti. Lactation Consultation Note  Patient Name: Boy Jorge NyKathryn Mefferd [id number]'s Date: 04/06/2015 Reason for consult: Follow-up assessment   Maternal Data Has patient been taught Hand Expression?: Yes Does the patient have breastfeeding experience prior to this delivery?: Yes  Feeding Feeding Type: Breast Fed  Baby has lost >8% from birth. The quality of the feeds has been been assessed and seems to be of good quality.  We are doing pre and post weights with the breast feeding scale to further assess the feeds. LATCH Score/Interventions Latch: Grasps breast easily, tongue down, lips flanged, rhythmical sucking. Intervention(s): Assist with latch  Audible Swallowing: Spontaneous and intermittent Intervention(s): Skin to skin  Type of Nipple: Everted at rest and after stimulation  Comfort (Breast/Nipple): Soft / non-tender     Hold (Positioning): No assistance needed to correctly position infant at breast. Intervention(s): Breastfeeding basics reviewed  LATCH Score: 10  Lactation Tools Discussed/Used [facility]: No   Consult Status      Trudee GripCarolyn P Tyler Cubit 04/06/2015, 1:22 PM

## 2015-04-06 NOTE — Progress Notes (Signed)
All discharge instructions given to patient and she voices understanding of all instructions given. Prescriptions given.  Pt already has f/u appt for incision and will make her own 6 wk f/u appt. Patient discharged home with spouse escorted out in wheelchair by cna.

## 2015-04-06 NOTE — Discharge Summary (Signed)
Physician Obstetric Discharge Summary  Patient ID: Kimberly Hanna MRN: [id number] DOB/AGE: [birth year] 28 y.o.   Date of Admission: 04/04/2015  Date of Discharge: 04/06/2015  Admitting Diagnosis: Scheduled cesarean section at [redacted]w[redacted]d  Secondary Diagnosis: Anemia in pregnancy  Mode of Delivery: repeat cesarean section 04/04/2015  low uterine, transverse     Discharge Diagnosis: No other diagnosis   Intrapartum Procedures: none   Post partum procedures: none  Complications: none   Brief Hospital Course   Kimberly Hanna is a [id number] who underwent cesarean section on 04/04/2015.  Patient had an uncomplicated surgery; for further details of this surgery, please refer to the operative note.  Patient had a postpartum course remarkable for anemia with postoperative hematocrit of 25.5%. She was asymptomatic/ hemodynamically stable and was begun on iron and vitamins.. By time of discharge on POD#2, her pain was controlled on oral pain medications and with ON Q pump; she had appropriate lochia and was ambulating, voiding without difficulty, tolerating regular diet and passing flatus.   She was deemed stable for discharge to home and will follow up in 4 days for an incision check  Labs: CBC Latest Ref Rng 04/05/2015 04/03/2015  WBC 3.6 - 11.0 K/uL 12.9(H) 8.4  Hemoglobin 12.0 - 16.0 g/dL 8.3(L) 11.8(L)  Hematocrit 35.0 - 47.0 % 25.5(L) 36.1  Platelets 150 - 440 K/uL 186 202   A POS/RI/ VI/ TDAP UTD   Physical exam:  Blood pressure 112/71, pulse 92, temperature 98.9 F (37.2 C), temperature source Oral, resp. rate 18, height  (1.676 m), weight 168 lb (76.204 kg), last menstrual period 06/24/2014, SpO2 98 %, unknown if currently breastfeeding. General: alert and no distress/ passing flatus Lochia: appropriate Abdomen: soft, NT, BS active Uterine Fundus: firm Incision: healing well, no significant drainage, no dehiscence, no significant erythema Extremities: No evidence of DVT  seen on physical exam. No lower extremity edema.  Discharge Instructions: Per After Visit Summary. Activity: Advance as tolerated. Pelvic rest for 6 weeks.  Also refer to Discharge Instructions Diet: Regular Medications:   Medication List    TAKE these medications        docusate sodium 100 MG capsule  Commonly known as:  COLACE  Take 1 capsule (100 mg total) by mouth 2 (two) times daily as needed for mild constipation.     ferrous fumarate 325 (106 FE) MG Tabs tablet  Commonly known as:  HEMOCYTE - 106 mg FE  Take 1 tablet by mouth daily.     ibuprofen 600 MG tablet  Commonly known as:  ADVIL,MOTRIN  Take 1 tablet (600 mg total) by mouth every 6 (six) hours as needed.     lanolin Oint  Apply 1 application topically as needed (for breast care).     norethindrone 0.35 MG tablet  Commonly known as:  ERRIN  Take 1 tablet (0.35 mg total) by mouth daily.     oxyCODONE-acetaminophen 5-325 MG tablet  Commonly known as:  PERCOCET/ROXICET  Take 1-2 tablets by mouth every 6 (six) hours as needed for moderate pain or severe pain.     PRENATAL VITAMINS PO  Take 1 capsule by mouth daily.       Outpatient follow up:  Follow-up Information    Follow up with Lorrene Reid, MD. Go in 4 days.   Specialty:  Obstetrics and Gynecology   Why:  For wound re-check-already scheduled   Contact information:   [address] [place] [address] [phone]  Postpartum contraception: oral progesterone-only contraceptive  Discharged Condition: stable  Discharged to: home   Newborn Data: Disposition:home with mother  [place]/ Breast/ 8#13oz  Apgars: APGAR (1 MIN): 8   APGAR (5 MINS): 9   APGAR (10 MINS):    Baby Feeding: Breast  Naiara Lombardozzi, CNM 04/06/2015 10:02 AM

## 2015-04-06 NOTE — Lactation Note (Signed)
This note was copied from the chart of Kimberly Jorge NyKathryn Prins. Lactation Consultation Note  Patient Name: Kimberly Hanna's Date: 04/06/2015 Reason for consult: Follow-up assessment   Maternal Data    Feeding    LATCH Score/Interventions Latch: Grasps breast easily, tongue down, lips flanged, rhythmical sucking.  Audible Swallowing: Spontaneous and intermittent Intervention(s): Skin to skin  Type of Nipple: Everted at rest and after stimulation  Comfort (Breast/Nipple): Soft / non-tender     Hold (Positioning): No assistance needed to correctly position infant at breast. Intervention(s): Breastfeeding basics reviewed  LATCH Score: 10  Lactation Tools Discussed/Used     Consult Status      Trudee GripCarolyn P Orlena Garmon 04/06/2015, 4:02 PM

## 2015-04-06 NOTE — Lactation Note (Signed)
This note was copied from the chart of Boy Jorge NyKathryn Ryder. Lactation Consultation Note  Patient Name: Boy Jorge NyKathryn Mcjunkins NWGNF'AToday's Date: 04/06/2015 Reason for consult: Follow-up assessment      Feeding   pre and post weight showing a transfer of 18 ml  LATCH Score/Interventions Latch: Grasps breast easily, tongue down, lips flanged, rhythmical sucking.  Audible Swallowing: Spontaneous and intermittent Intervention(s): Skin to skin  Type of Nipple: Everted at rest and after stimulation  Comfort (Breast/Nipple): Soft / non-tender     Hold (Positioning): No assistance needed to correctly position infant at breast. Intervention(s): Breastfeeding basics reviewed  LATCH Score: 10  Lactation Tools Discussed/Used     Consult Status      Trudee GripCarolyn P Analisse Randle 04/06/2015, 4:01 PM

## 2015-04-06 NOTE — Discharge Instructions (Signed)

## 2016-07-02 LAB — OB RESULTS CONSOLE GC/CHLAMYDIA
Chlamydia: NEGATIVE
Gonorrhea: NEGATIVE

## 2016-07-02 LAB — OB RESULTS CONSOLE ANTIBODY SCREEN: Antibody Screen: NEGATIVE

## 2016-07-02 LAB — OB RESULTS CONSOLE HIV ANTIBODY (ROUTINE TESTING): HIV: NONREACTIVE

## 2016-07-02 LAB — OB RESULTS CONSOLE HGB/HCT, BLOOD
HCT: 39 %
Hemoglobin: 12.6 g/dL

## 2016-07-02 LAB — OB RESULTS CONSOLE VARICELLA ZOSTER ANTIBODY, IGG: Varicella: IMMUNE

## 2016-07-02 LAB — OB RESULTS CONSOLE ABO/RH: RH TYPE: POSITIVE

## 2016-07-02 LAB — OB RESULTS CONSOLE PLATELET COUNT: Platelets: 222 10*3/uL

## 2016-07-02 LAB — OB RESULTS CONSOLE HEPATITIS B SURFACE ANTIGEN: HEP B S AG: NEGATIVE

## 2016-07-02 LAB — OB RESULTS CONSOLE RPR: RPR: NONREACTIVE

## 2016-07-02 LAB — OB RESULTS CONSOLE RUBELLA ANTIBODY, IGM: RUBELLA: IMMUNE

## 2016-09-16 ENCOUNTER — Other Ambulatory Visit: Payer: Self-pay | Admitting: Obstetrics & Gynecology

## 2016-09-16 DIAGNOSIS — Z3492 Encounter for supervision of normal pregnancy, unspecified, second trimester: Secondary | ICD-10-CM

## 2016-09-16 DIAGNOSIS — Z363 Encounter for antenatal screening for malformations: Secondary | ICD-10-CM

## 2016-09-18 ENCOUNTER — Ambulatory Visit (INDEPENDENT_AMBULATORY_CARE_PROVIDER_SITE_OTHER): Payer: BLUE CROSS/BLUE SHIELD | Admitting: Obstetrics & Gynecology

## 2016-09-18 ENCOUNTER — Ambulatory Visit (INDEPENDENT_AMBULATORY_CARE_PROVIDER_SITE_OTHER): Payer: BLUE CROSS/BLUE SHIELD

## 2016-09-18 ENCOUNTER — Encounter: Payer: Self-pay | Admitting: Obstetrics & Gynecology

## 2016-09-18 VITALS — BP 100/60 | Wt 150.0 lb

## 2016-09-18 DIAGNOSIS — Z98891 History of uterine scar from previous surgery: Secondary | ICD-10-CM

## 2016-09-18 DIAGNOSIS — Z3492 Encounter for supervision of normal pregnancy, unspecified, second trimester: Secondary | ICD-10-CM

## 2016-09-18 DIAGNOSIS — IMO0002 Reserved for concepts with insufficient information to code with codable children: Secondary | ICD-10-CM | POA: Insufficient documentation

## 2016-09-18 DIAGNOSIS — Z3490 Encounter for supervision of normal pregnancy, unspecified, unspecified trimester: Secondary | ICD-10-CM

## 2016-09-18 DIAGNOSIS — IMO0001 Reserved for inherently not codable concepts without codable children: Secondary | ICD-10-CM

## 2016-09-18 DIAGNOSIS — Z3A19 19 weeks gestation of pregnancy: Secondary | ICD-10-CM

## 2016-09-18 DIAGNOSIS — O0993 Supervision of high risk pregnancy, unspecified, third trimester: Secondary | ICD-10-CM | POA: Insufficient documentation

## 2016-09-18 DIAGNOSIS — Z363 Encounter for antenatal screening for malformations: Secondary | ICD-10-CM

## 2016-09-18 DIAGNOSIS — O350XX1 Maternal care for (suspected) central nervous system malformation in fetus, fetus 1: Secondary | ICD-10-CM

## 2016-09-18 NOTE — Progress Notes (Signed)
Prior CS history discussed; plan repeat CS.  Also plans BTL. US discussed. 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  Likely isolated finding, d/w pt. PNV.

## 2016-09-18 NOTE — Patient Instructions (Signed)
Plan repeat ultrasound at 28 weeks. Plan cesarean section at 39 weeks or so.

## 2016-09-19 ENCOUNTER — Telehealth: Payer: Self-pay | Admitting: Certified Nurse Midwife

## 2016-09-19 ENCOUNTER — Telehealth: Payer: Self-pay | Admitting: Obstetrics and Gynecology

## 2016-09-19 ENCOUNTER — Other Ambulatory Visit: Payer: Self-pay | Admitting: Certified Nurse Midwife

## 2016-09-19 ENCOUNTER — Other Ambulatory Visit: Payer: Self-pay | Admitting: Obstetrics & Gynecology

## 2016-09-19 DIAGNOSIS — IMO0001 Reserved for inherently not codable concepts without codable children: Secondary | ICD-10-CM

## 2016-09-19 DIAGNOSIS — Z3689 Encounter for other specified antenatal screening: Secondary | ICD-10-CM

## 2016-09-19 LAB — HM PAP SMEAR: Pap: NEGATIVE

## 2016-09-19 NOTE — Progress Notes (Signed)
Kimberly FanningJulie taking care of pt

## 2016-09-19 NOTE — Telephone Encounter (Signed)
Pt called nurse line stating she was told she choroid plexis cyst.  At the time she did not have any questions but now she does.  Would also like to have genetic testing done as she is 20wks and the cutoff is 21wks.  Please call [phone]

## 2016-09-19 NOTE — Telephone Encounter (Signed)
Discussed finding of choroid plexus cyst on recent anatomy scan and answered patient's questions. Has not had genetic testing. Explained she can still do the quad screen (up until 21 weeks) or the NIPT. DIscussed that this is probably a normal finding, as there was no other findings on ultrasound. She wants to have another ultrasound sooner than 8 weeks. Not sure if she wants genetic testing. Will schedule for ultrasound in 4 weeks.

## 2016-09-19 NOTE — Telephone Encounter (Signed)
[name] has spoken with pt regarding choroid plexus and will call again regarding quad screen.

## 2016-09-19 NOTE — Telephone Encounter (Signed)
Pt called after hours stating she is 20 weeks but wants to do the genetic testing. I believe she is too late for that but Sunny SchleinFelicia said there is another test she can do but we weren't too sure. Can you please advise?  Thank you

## 2016-09-19 NOTE — Telephone Encounter (Signed)
Please advise, [facility] is out of the office until Monday

## 2016-09-23 NOTE — Telephone Encounter (Signed)
FYI

## 2016-09-30 ENCOUNTER — Ambulatory Visit: Payer: Managed Care, Other (non HMO)

## 2016-10-16 ENCOUNTER — Ambulatory Visit (INDEPENDENT_AMBULATORY_CARE_PROVIDER_SITE_OTHER): Payer: BLUE CROSS/BLUE SHIELD | Admitting: Obstetrics and Gynecology

## 2016-10-16 ENCOUNTER — Ambulatory Visit (INDEPENDENT_AMBULATORY_CARE_PROVIDER_SITE_OTHER): Payer: BLUE CROSS/BLUE SHIELD

## 2016-10-16 ENCOUNTER — Encounter: Payer: BLUE CROSS/BLUE SHIELD | Admitting: Obstetrics and Gynecology

## 2016-10-16 VITALS — BP 120/54 | Wt 156.0 lb

## 2016-10-16 DIAGNOSIS — IMO0001 Reserved for inherently not codable concepts without codable children: Secondary | ICD-10-CM

## 2016-10-16 DIAGNOSIS — Z3A23 23 weeks gestation of pregnancy: Secondary | ICD-10-CM

## 2016-10-16 DIAGNOSIS — Z363 Encounter for antenatal screening for malformations: Secondary | ICD-10-CM

## 2016-10-16 DIAGNOSIS — Z98891 History of uterine scar from previous surgery: Secondary | ICD-10-CM

## 2016-10-16 DIAGNOSIS — O350XX1 Maternal care for (suspected) central nervous system malformation in fetus, fetus 1: Secondary | ICD-10-CM

## 2016-10-16 DIAGNOSIS — Z3490 Encounter for supervision of normal pregnancy, unspecified, unspecified trimester: Secondary | ICD-10-CM

## 2016-10-16 NOTE — Progress Notes (Signed)
Decreasing size choroid plexus

## 2016-10-16 NOTE — Patient Instructions (Signed)

## 2016-10-16 NOTE — Progress Notes (Signed)
U/S today/Heartburn

## 2016-10-21 ENCOUNTER — Other Ambulatory Visit: Payer: Managed Care, Other (non HMO)

## 2016-11-13 ENCOUNTER — Ambulatory Visit (INDEPENDENT_AMBULATORY_CARE_PROVIDER_SITE_OTHER): Payer: BLUE CROSS/BLUE SHIELD | Admitting: Obstetrics and Gynecology

## 2016-11-13 ENCOUNTER — Other Ambulatory Visit: Payer: BLUE CROSS/BLUE SHIELD

## 2016-11-13 VITALS — BP 122/74 | Wt 157.0 lb

## 2016-11-13 DIAGNOSIS — Z3A27 27 weeks gestation of pregnancy: Secondary | ICD-10-CM

## 2016-11-13 DIAGNOSIS — Z3A23 23 weeks gestation of pregnancy: Secondary | ICD-10-CM

## 2016-11-13 DIAGNOSIS — Z98891 History of uterine scar from previous surgery: Secondary | ICD-10-CM

## 2016-11-13 DIAGNOSIS — O0993 Supervision of high risk pregnancy, unspecified, third trimester: Secondary | ICD-10-CM

## 2016-11-13 NOTE — Progress Notes (Signed)
No vb. No lof. Pt c/o having some braxton hicks yesterday.  Will f/u choroid plexus cyst at 34 weeks, though pt reassured. Will cancel [facility] appt.  Schedule c-section appt next visit as schedule not available at this appt.

## 2016-11-14 ENCOUNTER — Telehealth: Payer: Self-pay | Admitting: *Deleted

## 2016-11-14 LAB — 28 WEEK RH+PANEL
Basophils Absolute: 0 x10E3/uL (ref 0.0–0.2)
Basos: 0 %
EOS (ABSOLUTE): 0.1 x10E3/uL (ref 0.0–0.4)
Eos: 2 %
Gestational Diabetes Screen: 112 mg/dL (ref 65–139)
HIV Screen 4th Generation wRfx: NONREACTIVE
Hematocrit: 31.4 % — ABNORMAL LOW (ref 34.0–46.6)
Hemoglobin: 10.5 g/dL — ABNORMAL LOW (ref 11.1–15.9)
Immature Grans (Abs): 0 x10E3/uL (ref 0.0–0.1)
Immature Granulocytes: 0 %
Lymphocytes Absolute: 1.5 x10E3/uL (ref 0.7–3.1)
Lymphs: 24 %
MCH: 28.8 pg (ref 26.6–33.0)
MCHC: 33.4 g/dL (ref 31.5–35.7)
MCV: 86 fL (ref 79–97)
Monocytes Absolute: 0.5 x10E3/uL (ref 0.1–0.9)
Monocytes: 8 %
Neutrophils Absolute: 4.3 x10E3/uL (ref 1.4–7.0)
Neutrophils: 66 %
Platelets: 213 x10E3/uL (ref 150–379)
RBC: 3.65 x10E6/uL — ABNORMAL LOW (ref 3.77–5.28)
RDW: 14.3 % (ref 12.3–15.4)
RPR Ser Ql: NONREACTIVE
WBC: 6.4 x10E3/uL (ref 3.4–10.8)

## 2016-11-14 NOTE — Telephone Encounter (Signed)
[facility]16 for a detailed U/S.  Called patient this morning and left message asking her to call us back to reschedule.

## 2016-11-21 ENCOUNTER — Inpatient Hospital Stay
Admission: RE | Admit: 2016-11-21 | Discharge: 2016-11-21 | Disposition: A | Discharge status: Home / Self Care | Payer: Managed Care, Other (non HMO) | Source: Ambulatory Visit | Attending: Obstetrics & Gynecology | Admitting: Obstetrics & Gynecology

## 2016-11-21 DIAGNOSIS — Z3689 Encounter for other specified antenatal screening: Secondary | ICD-10-CM

## 2016-11-27 ENCOUNTER — Ambulatory Visit (INDEPENDENT_AMBULATORY_CARE_PROVIDER_SITE_OTHER): Payer: BLUE CROSS/BLUE SHIELD | Admitting: Obstetrics and Gynecology

## 2016-11-27 ENCOUNTER — Telehealth: Payer: Self-pay | Admitting: Obstetrics and Gynecology

## 2016-11-27 VITALS — BP 110/60 | Wt 161.0 lb

## 2016-11-27 DIAGNOSIS — Z3A29 29 weeks gestation of pregnancy: Secondary | ICD-10-CM

## 2016-11-27 DIAGNOSIS — O350XX1 Maternal care for (suspected) central nervous system malformation in fetus, fetus 1: Secondary | ICD-10-CM

## 2016-11-27 DIAGNOSIS — IMO0001 Reserved for inherently not codable concepts without codable children: Secondary | ICD-10-CM

## 2016-11-27 DIAGNOSIS — Z98891 History of uterine scar from previous surgery: Secondary | ICD-10-CM

## 2016-11-27 DIAGNOSIS — O0993 Supervision of high risk pregnancy, unspecified, third trimester: Secondary | ICD-10-CM

## 2016-11-27 NOTE — Telephone Encounter (Signed)
Patient is aware of H&P on 01/29/17 @ 8:30am with Pre-admit Testing afterwards, and OR on 01/30/17.

## 2016-11-27 NOTE — Progress Notes (Signed)
Per pt request TDAP NV

## 2016-11-27 NOTE — Telephone Encounter (Signed)
-----   Message from Vena AustriaAndreas Staebler, MD sent at 11/27/2016  3:14 PM EDT ----- Regarding: c section Surgery Date: 01/31/15  LOS: inpatient  Surgery Booking Request Patient Full Name:Yanik, Dierdre HarnessKathryn K MRN: [id number]  DOB: 06/22/1986  Surgeon: On call surgeon Requested Surgery Date and Time: 01/31/15 Primary Diagnosis and Code: Repeat C-section Secondary Diagnosis and Code:  Surgical Procedure: Cesarean Section L&D Notification:yes Admission Status: inpatient Length of Surgery: 1h Special Case Needs: none H&P:  (date) Phone Interview or Office Pre-Admit: preadmit Interpreter: no Language: english Medical Clearance: none Special Scheduling Instructions: none

## 2016-12-04 ENCOUNTER — Encounter: Payer: Self-pay | Admitting: Obstetrics and Gynecology

## 2016-12-16 ENCOUNTER — Ambulatory Visit (INDEPENDENT_AMBULATORY_CARE_PROVIDER_SITE_OTHER): Payer: BLUE CROSS/BLUE SHIELD | Admitting: Obstetrics and Gynecology

## 2016-12-16 VITALS — BP 98/58 | Wt 162.0 lb

## 2016-12-16 DIAGNOSIS — Z3A32 32 weeks gestation of pregnancy: Secondary | ICD-10-CM

## 2016-12-16 DIAGNOSIS — O0993 Supervision of high risk pregnancy, unspecified, third trimester: Secondary | ICD-10-CM

## 2016-12-16 DIAGNOSIS — O350XX1 Maternal care for (suspected) central nervous system malformation in fetus, fetus 1: Secondary | ICD-10-CM

## 2016-12-16 DIAGNOSIS — IMO0001 Reserved for inherently not codable concepts without codable children: Secondary | ICD-10-CM

## 2016-12-16 DIAGNOSIS — Z23 Encounter for immunization: Secondary | ICD-10-CM | POA: Diagnosis not present

## 2016-12-16 NOTE — Progress Notes (Signed)
TDAP/Blood consent today No concerns

## 2016-12-31 ENCOUNTER — Ambulatory Visit (INDEPENDENT_AMBULATORY_CARE_PROVIDER_SITE_OTHER): Payer: BLUE CROSS/BLUE SHIELD

## 2016-12-31 ENCOUNTER — Ambulatory Visit (INDEPENDENT_AMBULATORY_CARE_PROVIDER_SITE_OTHER): Payer: BLUE CROSS/BLUE SHIELD | Admitting: Obstetrics and Gynecology

## 2016-12-31 VITALS — BP 104/60 | Wt 166.0 lb

## 2016-12-31 DIAGNOSIS — O350XX1 Maternal care for (suspected) central nervous system malformation in fetus, fetus 1: Secondary | ICD-10-CM

## 2016-12-31 DIAGNOSIS — Z3A34 34 weeks gestation of pregnancy: Secondary | ICD-10-CM

## 2016-12-31 DIAGNOSIS — IMO0001 Reserved for inherently not codable concepts without codable children: Secondary | ICD-10-CM

## 2016-12-31 DIAGNOSIS — O0993 Supervision of high risk pregnancy, unspecified, third trimester: Secondary | ICD-10-CM

## 2016-12-31 DIAGNOSIS — Z98891 History of uterine scar from previous surgery: Secondary | ICD-10-CM

## 2016-12-31 NOTE — Progress Notes (Signed)
No vb. No lof. Choroid pelxus cyst repeat U/s today.  Growth 52nd%ile, AFI nml, No CP cyst seen. Discussed TOLAC.

## 2017-01-15 ENCOUNTER — Ambulatory Visit (INDEPENDENT_AMBULATORY_CARE_PROVIDER_SITE_OTHER): Payer: BLUE CROSS/BLUE SHIELD | Admitting: Obstetrics and Gynecology

## 2017-01-15 VITALS — BP 110/64 | Wt 168.0 lb

## 2017-01-15 DIAGNOSIS — Z3A36 36 weeks gestation of pregnancy: Secondary | ICD-10-CM

## 2017-01-15 DIAGNOSIS — O0993 Supervision of high risk pregnancy, unspecified, third trimester: Secondary | ICD-10-CM

## 2017-01-15 DIAGNOSIS — Z98891 History of uterine scar from previous surgery: Secondary | ICD-10-CM

## 2017-01-15 NOTE — Progress Notes (Signed)
Prenatal Visit Note Date: 01/15/2017 Clinic: [facility]  Subjective:  Kimberly Hanna is a 31 y.o. G3P2002 at 3058w6d being seen today for ongoing prenatal care.  She is currently monitored for the following issues for this high-risk pregnancy and has Previous cesarean section; Supervision of high risk pregnancy, antepartum, third trimester; and Choroid plexus cyst of fetus on her problem list.   Patient reports no bleeding, no contractions and no leaking.   Contractions: Irregular. Vag. Bleeding: None.  Movement: Present. Denies leaking of fluid.   The following portions of the patient's history were reviewed and updated as appropriate: allergies, current medications, past family history, past medical history, past social history, past surgical history and problem list. Problem list updated.  Objective:   Vitals:   01/15/17 1410  BP: 110/64  Weight: 168 lb (76.2 kg)    Total weight gain so far: 23 lb (10.4 kg)   Fetal Status: Fetal Heart Rate (bpm): 130 Fundal Height: 37 cm Movement: Present     General:  Alert, oriented and cooperative. Patient is in no acute distress.  Skin: Skin is warm and dry. No rash noted.   Cardiovascular: Normal heart rate noted  Respiratory: Normal respiratory effort, no problems with respiration noted  Abdomen: Soft, gravid, appropriate for gestational age. Pain/Pressure: Present     Pelvic:  Cervical exam performed Dilation: Closed      Extremities: Normal range of motion.     Mental Status: Normal mood and affect. Normal behavior. Normal judgment and thought content.   Urinalysis: Urine Protein: Negative Urine Glucose: Negative  Assessment and Plan:  Pregnancy: G3P2002 at 2558w6d  1. Supervision of high risk pregnancy, antepartum, third trimester - Strep Gp B NAA - GC/Chlamydia Probe Amp  2. Previous cesarean section Schedule for 8/9 with BTL  3. [redacted] weeks gestation of pregnancy - Strep Gp B NAA - GC/Chlamydia Probe Amp  Term labor  symptoms and general obstetric precautions including but not limited to vaginal bleeding, contractions, leaking of fluid and fetal movement were reviewed in detail with the patient. Please refer to After Visit Summary for other counseling recommendations.   Return in about 1 week (around 01/22/2017) for Routine Prenatal Appointment.   Thomasene MohairStephen Yaresly Menzel, MD 01/15/2017 2:28 PM

## 2017-01-17 LAB — STREP GP B NAA: STREP GROUP B AG: NEGATIVE

## 2017-01-17 LAB — GC/CHLAMYDIA PROBE AMP
Chlamydia trachomatis, NAA: NEGATIVE
Neisseria gonorrhoeae by PCR: NEGATIVE

## 2017-01-22 ENCOUNTER — Ambulatory Visit (INDEPENDENT_AMBULATORY_CARE_PROVIDER_SITE_OTHER): Payer: BLUE CROSS/BLUE SHIELD | Admitting: Obstetrics and Gynecology

## 2017-01-22 VITALS — BP 128/58 | Wt 170.0 lb

## 2017-01-22 DIAGNOSIS — Z3A38 38 weeks gestation of pregnancy: Secondary | ICD-10-CM

## 2017-01-22 DIAGNOSIS — O0993 Supervision of high risk pregnancy, unspecified, third trimester: Secondary | ICD-10-CM

## 2017-01-22 DIAGNOSIS — Z98891 History of uterine scar from previous surgery: Secondary | ICD-10-CM

## 2017-01-22 NOTE — Progress Notes (Signed)
Loss of vision x20 minutes/hand going numb/disoriented  Completely resolved no residual symptoms.  No history of atypical migraines Discuss BTL options

## 2017-01-25 ENCOUNTER — Observation Stay
Admission: EM | Admit: 2017-01-25 | Discharge: 2017-01-25 | Disposition: A | Discharge status: Home / Self Care | Payer: BLUE CROSS/BLUE SHIELD | Attending: Obstetrics and Gynecology | Admitting: Obstetrics and Gynecology

## 2017-01-25 DIAGNOSIS — F419 Anxiety disorder, unspecified: Secondary | ICD-10-CM | POA: Diagnosis not present

## 2017-01-25 DIAGNOSIS — Z9889 Other specified postprocedural states: Secondary | ICD-10-CM | POA: Insufficient documentation

## 2017-01-25 DIAGNOSIS — O99343 Other mental disorders complicating pregnancy, third trimester: Secondary | ICD-10-CM | POA: Diagnosis not present

## 2017-01-25 DIAGNOSIS — Z3A38 38 weeks gestation of pregnancy: Secondary | ICD-10-CM | POA: Diagnosis not present

## 2017-01-25 DIAGNOSIS — Z79899 Other long term (current) drug therapy: Secondary | ICD-10-CM | POA: Insufficient documentation

## 2017-01-25 MED ORDER — CALCIUM CARBONATE ANTACID 500 MG PO CHEW: 2.0000 | CHEWABLE_TABLET | ORAL | Status: DC | PRN

## 2017-01-25 MED ORDER — ACETAMINOPHEN 325 MG PO TABS: 650.0000 mg | ORAL_TABLET | ORAL | Status: DC | PRN

## 2017-01-25 NOTE — Progress Notes (Signed)
Pt arrived to BP triage in no acute distress, states she has been feeling painful contractions coming every 5 mins since about 4am, denies spotting, leaking fluid or n/v. Confirms +FM. Says she has hx previous c-section for FTP, desires VBAC with this pregnancy if possible but has not signed consent. Explained to pt that will perform exam and make MD aware.

## 2017-01-25 NOTE — Discharge Summary (Signed)
Physician Final Progress Note  Patient ID: Paul HalfKathryn K Kimery MRN: [id number] DOB/AGE: 31/09/1985 31 y.o.  Admit date: 01/25/2017 Admitting provider: Conard NovakStephen D Jackson, MD Discharge date: 01/25/2017   Admission Diagnoses: contractions  Discharge Diagnoses:  Active Problems:   Indication for care in labor or delivery IUP at 8737w2d with history of 2 previous c/s, desires TOLAC, with reactive NST, not in labor  History of Present Illness: The patient is a [age] y.o. female G3P2002 at 4337w2d who presents for contractions that began earlier this morning. She states they had been 5 minutes apart for 2 hours prior to arriving at L&D. She admits positive fetal movement. She denies LOF. She has had some spotting following the initial cervical exam today. Her questions regarding TOLAC are answered and she is discharged to home with precautions.    Past Medical History:  Diagnosis Date  . Anxiety   . Complication of anesthesia    shaking/ headache  . Indigestion     Past Surgical History:  Procedure Laterality Date  . CESAREAN SECTION    . CESAREAN SECTION N/A 04/04/2015   Procedure: CESAREAN SECTION;  Surgeon: Vena AustriaAndreas Staebler, MD;  Location: [facility] ORS;  Service: Obstetrics;  Laterality: N/A;    No current facility-administered medications on file prior to encounter.    Current Outpatient Prescriptions on File Prior to Encounter  Medication Sig Dispense Refill  . ranitidine (ZANTAC) 150 MG tablet Take 150 mg by mouth 2 (two) times daily as needed for heartburn.      No Known Allergies  Social History   Social History  . Marital status: Married    Spouse name: N/A  . Number of children: N/A  . Years of education: N/A   Occupational History  . Not on file.   Social History Main Topics  . Smoking status: Never Smoker  . Smokeless tobacco: Never Used  . Alcohol use No  . Drug use: No  . Sexual activity: Not on file   Other Topics Concern  . Not on file   Social History  Narrative  . No narrative on file    Physical Exam: BP 122/83   Pulse (!) 104   Temp 98.5 F (36.9 C) (Oral)   Resp 18   Ht 5\' 6"  (1.676 m)   Wt 170 lb (77.1 kg)   LMP 04/29/2016   BMI 27.44 kg/m   Gen: NAD CV: RRR Pulm: CTAB Pelvic: FT/70-80/ballotable/spotting following initial exam Toco: every 6-8 minutes Fetal Well Being: 130 bpm, moderate variability, +accelerations, -decelerations Ext: no evidence of DVT  Consults: None  Significant Findings/ Diagnostic Studies: none  Procedures: NST  Discharge Condition: good  Disposition: 01-Home or Self Care  Diet: Regular diet  Discharge Activity: Activity as tolerated  Discharge Instructions    Discharge activity:  No Restrictions    Complete by:  As directed    Discharge diet:  No restrictions    Complete by:  As directed    Fetal Kick Count:  Lie on our left side for one hour after a meal, and count the number of times your baby kicks.  If it is less than 5 times, get up, move around and drink some juice.  Repeat the test 30 minutes later.  If it is still less than 5 kicks in an hour, notify your doctor.    Complete by:  As directed    LABOR:  When conractions begin, you should start to time them from the beginning of one contraction to the  beginning  of the next.  When contractions are 5 - 10 minutes apart or less and have been regular for at least an hour, you should call your health care provider.    Complete by:  As directed    No sexual activity restrictions    Complete by:  As directed    Notify physician for bleeding from the vagina    Complete by:  As directed    Notify physician for blurring of vision or spots before the eyes    Complete by:  As directed    Notify physician for chills or fever    Complete by:  As directed    Notify physician for fainting spells, "black outs" or loss of consciousness    Complete by:  As directed    Notify physician for increase in vaginal discharge    Complete by:  As  directed    Notify physician for leaking of fluid    Complete by:  As directed    Notify physician for pain or burning when urinating    Complete by:  As directed    Notify physician for pelvic pressure (sudden increase)    Complete by:  As directed    Notify physician for severe or continued nausea or vomiting    Complete by:  As directed    Notify physician for sudden gushing of fluid from the vagina (with or without continued leaking)    Complete by:  As directed    Notify physician for sudden, constant, or occasional abdominal pain    Complete by:  As directed    Notify physician if baby moving less than usual    Complete by:  As directed      Allergies as of 01/25/2017   No Known Allergies     Medication List    TAKE these medications   ranitidine 150 MG tablet Commonly known as:  ZANTAC Take 150 mg by mouth 2 (two) times daily as needed for heartburn.      Follow-up Information    [facility].   Why:  go to regular scheduled prenatal visit Contact information: [address] [place] [phone]          Total time spent taking care of this patient: 20 minutes  Signed: Tresea MallJane Autrey Human, CNM  01/25/2017, 9:42 AM

## 2017-01-25 NOTE — Progress Notes (Signed)
Pt states she was seen in the office last Wed, cervix was closed. Says she is still feeling discomfort in lower back, painful contractions now, coming every 5 mins, lasting approx 40-60 sec with  occasional pressure, sat on bouncing ball but denies taking any med or doing anything to relieve pain.

## 2017-01-25 NOTE — Progress Notes (Signed)
Spoke with Dr Jean RosenthalJackson about pt presenting to triage with c/o painful contractions, pt calm and relaxed, no grimacing or expression of laboring, explained cervical exam done by 2 RNs and fingertip, light bleeding. Per Dr Jean RosenthalJackson repeat exam in 1-2 hours and repeat vital signs.

## 2017-01-29 ENCOUNTER — Encounter: Payer: Self-pay | Admitting: Obstetrics and Gynecology

## 2017-01-29 ENCOUNTER — Encounter
Admission: RE | Admit: 2017-01-29 | Discharge: 2017-01-29 | Disposition: A | Discharge status: Home / Self Care | Payer: BLUE CROSS/BLUE SHIELD | Source: Ambulatory Visit | Attending: Obstetrics and Gynecology | Admitting: Obstetrics and Gynecology

## 2017-01-29 ENCOUNTER — Ambulatory Visit (INDEPENDENT_AMBULATORY_CARE_PROVIDER_SITE_OTHER): Payer: BLUE CROSS/BLUE SHIELD | Admitting: Obstetrics and Gynecology

## 2017-01-29 VITALS — BP 122/70 | HR 97 | Ht 66.0 in | Wt 171.0 lb

## 2017-01-29 DIAGNOSIS — Z3A39 39 weeks gestation of pregnancy: Secondary | ICD-10-CM

## 2017-01-29 HISTORY — DX: Anemia, unspecified: D64.9

## 2017-01-29 LAB — CBC
HEMATOCRIT: 31.1 % — AB (ref 35.0–47.0)
HEMOGLOBIN: 9.9 g/dL — AB (ref 12.0–16.0)
MCH: 24.7 pg — AB (ref 26.0–34.0)
MCHC: 31.8 g/dL — AB (ref 32.0–36.0)
MCV: 77.5 fL — ABNORMAL LOW (ref 80.0–100.0)
Platelets: 209 10*3/uL (ref 150–440)
RBC: 4.02 MIL/uL (ref 3.80–5.20)
RDW: 17.3 % — ABNORMAL HIGH (ref 11.5–14.5)
WBC: 6.5 10*3/uL (ref 3.6–11.0)

## 2017-01-29 LAB — TYPE AND SCREEN
ABO/RH(D): A POS
ANTIBODY SCREEN: NEGATIVE
EXTEND SAMPLE REASON: UNDETERMINED

## 2017-01-29 NOTE — Patient Instructions (Signed)
  Your procedure is scheduled on: 01/30/17 Thurs Report to Emergency Room @ 5:45 AM and up to 3rd floor labor and delivery Remember: Instructions that are not followed completely may result in serious medical risk, up to and including death, or upon the discretion of your surgeon and anesthesiologist your surgery may need to be rescheduled.    _x___ 1. Do not eat food or drink liquids after midnight. No gum chewing or                              hard candies.     __x__ 2. No Alcohol for 24 hours before or after surgery.   __x__3. No Smoking for 24 prior to surgery.   ____  4. Bring all medications with you on the day of surgery if instructed.    __x__ 5. Notify your doctor if there is any change in your medical condition     (cold, fever, infections).     Do not wear jewelry, make-up, hairpins, clips or nail polish.  Do not wear lotions, powders, or perfumes. You may wear deodorant.  Do not shave 48 hours prior to surgery. Men may shave face and neck.  Do not bring valuables to the hospital.    [facility] is not responsible for any belongings or valuables.               Contacts, dentures or bridgework may not be worn into surgery.  Leave your suitcase in the car. After surgery it may be brought to your room.  For patients admitted to the hospital, discharge time is determined by your                       treatment team.   Patients discharged the day of surgery will not be allowed to drive home.  You will need someone to drive you home and stay with you the night of your procedure.    Please read over the following fact sheets that you were given:   [facility] Preparing for Surgery and or MRSA Information   _x___ Take anti-hypertensive (unless it includes a diuretic), cardiac, seizure, asthma,     anti-reflux and psychiatric medicines. These include:  1. ranitidine (ZANTAC)   2.  3.  4.  5.  6.  ____Fleets enema or Magnesium Citrate as directed.   _x___ Use CHG Soap or sage  wipes as directed on instruction sheet   ____ Use inhalers on the day of surgery and bring to hospital day of surgery  ____ Stop Metformin and Janumet 2 days prior to surgery.    ____ Take 1/2 of usual insulin dose the night before surgery and none on the morning     surgery.   _x___ Follow recommendations from Cardiologist, Pulmonologist or PCP regarding          stopping Aspirin, Coumadin, Pllavix ,Eliquis, Effient, or Pradaxa, and Pletal.  X____Stop Anti-inflammatories such as Advil, Aleve, Ibuprofen, Motrin, Naproxen, Naprosyn, Goodies powders or aspirin products. OK to take Tylenol and                          Celebrex.   _x___ Stop supplements until after surgery.  But may continue Vitamin D, Vitamin B,       and multivitamin.   ____ Bring C-Pap to the hospital.

## 2017-01-29 NOTE — Progress Notes (Signed)
Obstetrics & Gynecology Surgery H&P    Chief Complaint: Scheduled Surgery   History of Present Illness: Patient is a 31 y.o. Z6X0960G3P2002 presenting for scheduled Cesarean section and BTL for the treatment or further evaluation of history of prior C-section and undesired fertility. Prenatal care uncomplicated, patient with isolated choroid pelxus cyst resolved on follow up imaging.     Review of Systems:10 point review of systems  Past Medical History:  Past Medical History:  Diagnosis Date  . Anxiety   . Complication of anesthesia    shaking/ headache  . Indigestion     Past Surgical History:  Past Surgical History:  Procedure Laterality Date  . CESAREAN SECTION    . CESAREAN SECTION N/A 04/04/2015   Procedure: CESAREAN SECTION;  Surgeon: Vena AustriaAndreas Shandelle Borrelli, MD;  Location: [facility] ORS;  Service: Obstetrics;  Laterality: N/A;    Family History:  History reviewed. No pertinent family history.  Social History:  Social History   Social History  . Marital status: Married    Spouse name: N/A  . Number of children: N/A  . Years of education: N/A   Occupational History  . Not on file.   Social History Main Topics  . Smoking status: Never Smoker  . Smokeless tobacco: Never Used  . Alcohol use No  . Drug use: No  . Sexual activity: Yes   Other Topics Concern  . Not on file   Social History Narrative  . No narrative on file    Allergies:  No Known Allergies  Medications: Prior to Admission medications   Medication Sig Start Date End Date Taking? Authorizing Provider  ranitidine (ZANTAC) 150 MG tablet Take 150 mg by mouth 2 (two) times daily as needed for heartburn.   Yes [provider]    Physical Exam Vitals: Blood pressure 122/70, pulse 97, height 5\' 6"  (1.676 m), weight 171 lb (77.6 kg), last menstrual period 04/29/2016, unknown if currently breastfeeding. General: NAD HEENT: normocephalic, anicteric Pulmonary: No increased work of  breathing Cardiovascular: RRR, distal pulses 2+ Abdomen: Gravid, non-tender, FHT 140 Genitourinary: cervix closed Extremities: no edema, erythema, or tenderness Neurologic: Grossly intact Psychiatric: mood appropriate, affect full  Imaging No results found.  Assessment: 31 y.o. A5W0981G3P2002 presenting for scheduled repeat C-section and BTL  Plan: 1)The patient was counseled regarding risk and benefits to proceeding with Cesarean section to expedite delivery.  Risk of cesarean section were discussed including risk of bleeding and need for potential intraoperative or postoperative blood transfusion with a rate of approximately 5% quoted for all Cesarean sections, risk of injury to adjacent organs including but not limited to bowl and bladder, the need for additional surgical procedures to address such injuries, and the risk of infection.    31 y.o. [id number]  with undesired fertility, desires permanent sterilization.  Other reversible forms of contraception were discussed with patient; she declines all other modalities. Permanent nature of as well as associated risks of the procedure discussed with patient including but not limited to: risk of regret, permanence of method, bleeding, infection, injury to surrounding organs and need for additional procedures.  Failure risk of 0.5-1% with increased risk of ectopic gestation if pregnancy occurs was also discussed with patient.    2) Routine postoperative instructions were reviewed with the patient and her family in detail today including the expected length of recovery and likely postoperative course.  The patient concurred with the proposed plan, giving informed written consent for the surgery today.  Patient instructed  on the importance of being NPO after midnight prior to her procedure.  If warranted preoperative prophylactic antibiotics and SCDs ordered on call to the OR to meet SCIP guidelines and adhere to recommendation laid forth in ACOG Practice  Bulletin Number 104 May 2009  "Antibiotic Prophylaxis for Gynecologic Procedures".

## 2017-01-30 ENCOUNTER — Inpatient Hospital Stay: Payer: BLUE CROSS/BLUE SHIELD | Admitting: Anesthesiology

## 2017-01-30 ENCOUNTER — Encounter
Admission: RE | Disposition: A | Discharge status: Home / Self Care | Payer: Self-pay | Source: Ambulatory Visit | Attending: Obstetrics and Gynecology

## 2017-01-30 ENCOUNTER — Inpatient Hospital Stay
Admission: RE | Admit: 2017-01-30 | Discharge: 2017-02-01 | DRG: 765 | Disposition: A | Discharge status: Home / Self Care | Payer: BLUE CROSS/BLUE SHIELD | Source: Ambulatory Visit | Attending: Obstetrics and Gynecology | Admitting: Obstetrics and Gynecology

## 2017-01-30 DIAGNOSIS — D62 Acute posthemorrhagic anemia: Secondary | ICD-10-CM | POA: Diagnosis not present

## 2017-01-30 DIAGNOSIS — Z3A39 39 weeks gestation of pregnancy: Secondary | ICD-10-CM

## 2017-01-30 DIAGNOSIS — Z302 Encounter for sterilization: Secondary | ICD-10-CM | POA: Diagnosis not present

## 2017-01-30 DIAGNOSIS — O0993 Supervision of high risk pregnancy, unspecified, third trimester: Secondary | ICD-10-CM

## 2017-01-30 DIAGNOSIS — O34211 Maternal care for low transverse scar from previous cesarean delivery: Principal | ICD-10-CM | POA: Diagnosis present

## 2017-01-30 DIAGNOSIS — O9081 Anemia of the puerperium: Secondary | ICD-10-CM | POA: Diagnosis not present

## 2017-01-30 HISTORY — PX: TUBAL LIGATION: SHX77

## 2017-01-30 LAB — RPR: RPR: NONREACTIVE

## 2017-01-30 SURGERY — Surgical Case
Anesthesia: Spinal

## 2017-01-30 MED ORDER — IBUPROFEN 600 MG PO TABS
600.0000 mg | ORAL_TABLET | Freq: Four times a day (QID) | ORAL | Status: DC
  Administered 2017-01-31: 600 mg via ORAL
  Filled 2017-01-30: qty 1

## 2017-01-30 MED ORDER — ACETAMINOPHEN 500 MG PO TABS
1000.0000 mg | ORAL_TABLET | Freq: Four times a day (QID) | ORAL | Status: DC
  Administered 2017-01-30 (×2): 1000 mg via ORAL
  Filled 2017-01-30 (×2): qty 2

## 2017-01-30 MED ORDER — NALOXONE HCL 0.4 MG/ML IJ SOLN: 0.4000 mg | INTRAMUSCULAR | Status: DC | PRN

## 2017-01-30 MED ORDER — DIPHENHYDRAMINE HCL 50 MG/ML IJ SOLN
12.5000 mg | INTRAMUSCULAR | Status: DC | PRN
  Administered 2017-01-30: 12.5 mg via INTRAVENOUS
  Filled 2017-01-30: qty 1

## 2017-01-30 MED ORDER — NALBUPHINE HCL 10 MG/ML IJ SOLN: 5.0000 mg | Freq: Once | INTRAMUSCULAR | Status: DC | PRN

## 2017-01-30 MED ORDER — KETOROLAC TROMETHAMINE 30 MG/ML IJ SOLN
30.0000 mg | Freq: Four times a day (QID) | INTRAMUSCULAR | Status: AC
  Filled 2017-01-30: qty 1

## 2017-01-30 MED ORDER — SOD CITRATE-CITRIC ACID 500-334 MG/5ML PO SOLN
30.0000 mL | Freq: Once | ORAL | Status: AC
  Administered 2017-01-30: 30 mL via ORAL
  Filled 2017-01-30: qty 15

## 2017-01-30 MED ORDER — SIMETHICONE 80 MG PO CHEW: 80.0000 mg | CHEWABLE_TABLET | ORAL | Status: DC | PRN

## 2017-01-30 MED ORDER — DIBUCAINE 1 % RE OINT: 1.0000 "application " | TOPICAL_OINTMENT | RECTAL | Status: DC | PRN

## 2017-01-30 MED ORDER — OXYTOCIN 40 UNITS IN LACTATED RINGERS INFUSION - SIMPLE MED: 2.5000 [IU]/h | INTRAVENOUS | Status: AC

## 2017-01-30 MED ORDER — OXYCODONE HCL 5 MG PO TABS: 10.0000 mg | ORAL_TABLET | ORAL | Status: DC | PRN

## 2017-01-30 MED ORDER — MEPERIDINE HCL 25 MG/ML IJ SOLN: 6.2500 mg | INTRAMUSCULAR | Status: DC | PRN

## 2017-01-30 MED ORDER — ACETAMINOPHEN 500 MG PO TABS
1000.0000 mg | ORAL_TABLET | Freq: Four times a day (QID) | ORAL | Status: AC
  Administered 2017-01-30 – 2017-01-31 (×2): 1000 mg via ORAL
  Filled 2017-01-30 (×2): qty 2

## 2017-01-30 MED ORDER — EPHEDRINE SULFATE 50 MG/ML IJ SOLN
INTRAMUSCULAR | Status: AC
  Filled 2017-01-30: qty 1

## 2017-01-30 MED ORDER — SENNOSIDES-DOCUSATE SODIUM 8.6-50 MG PO TABS
2.0000 | ORAL_TABLET | ORAL | Status: DC
  Administered 2017-01-31: 2 via ORAL
  Filled 2017-01-30 (×2): qty 2

## 2017-01-30 MED ORDER — SIMETHICONE 80 MG PO CHEW
80.0000 mg | CHEWABLE_TABLET | ORAL | Status: DC
  Administered 2017-01-31 – 2017-02-01 (×2): 80 mg via ORAL
  Filled 2017-01-30 (×2): qty 1

## 2017-01-30 MED ORDER — BUPIVACAINE IN DEXTROSE 0.75-8.25 % IT SOLN
INTRATHECAL | Status: DC | PRN
  Administered 2017-01-30: 1.8 mL via INTRATHECAL

## 2017-01-30 MED ORDER — ACETAMINOPHEN 325 MG PO TABS
650.0000 mg | ORAL_TABLET | ORAL | Status: DC | PRN
  Administered 2017-02-01: 650 mg via ORAL
  Filled 2017-01-30: qty 2

## 2017-01-30 MED ORDER — MORPHINE SULFATE (PF) 0.5 MG/ML IJ SOLN
INTRAMUSCULAR | Status: AC
  Filled 2017-01-30: qty 10

## 2017-01-30 MED ORDER — LACTATED RINGERS IV SOLN
INTRAVENOUS | Status: DC
  Administered 2017-01-30 – 2017-01-31 (×2): via INTRAVENOUS

## 2017-01-30 MED ORDER — SIMETHICONE 80 MG PO CHEW
80.0000 mg | CHEWABLE_TABLET | Freq: Three times a day (TID) | ORAL | Status: DC
  Administered 2017-01-30 – 2017-02-01 (×6): 80 mg via ORAL
  Filled 2017-01-30 (×6): qty 1

## 2017-01-30 MED ORDER — ONDANSETRON HCL 4 MG/2ML IJ SOLN
INTRAMUSCULAR | Status: AC
  Filled 2017-01-30: qty 2

## 2017-01-30 MED ORDER — OXYTOCIN 40 UNITS IN LACTATED RINGERS INFUSION - SIMPLE MED
INTRAVENOUS | Status: DC | PRN
  Administered 2017-01-30: 400 mL via INTRAVENOUS

## 2017-01-30 MED ORDER — MORPHINE SULFATE (PF) 0.5 MG/ML IJ SOLN
INTRAMUSCULAR | Status: DC | PRN
  Administered 2017-01-30: .1 mg via INTRATHECAL

## 2017-01-30 MED ORDER — SODIUM CHLORIDE 0.9 % IV SOLN
INTRAVENOUS | Status: DC | PRN
  Administered 2017-01-30: 50 ug/min via INTRAVENOUS

## 2017-01-30 MED ORDER — KETOROLAC TROMETHAMINE 30 MG/ML IJ SOLN: 30.0000 mg | Freq: Four times a day (QID) | INTRAMUSCULAR | Status: DC

## 2017-01-30 MED ORDER — OXYCODONE HCL 5 MG PO TABS: 5.0000 mg | ORAL_TABLET | ORAL | Status: DC | PRN

## 2017-01-30 MED ORDER — CEFAZOLIN SODIUM-DEXTROSE 2-4 GM/100ML-% IV SOLN
2.0000 g | Freq: Once | INTRAVENOUS | Status: AC
Start: 2017-01-30 — End: 2017-01-30
  Administered 2017-01-30: 2 g via INTRAVENOUS
  Filled 2017-01-30: qty 100

## 2017-01-30 MED ORDER — COCONUT OIL OIL
1.0000 "application " | TOPICAL_OIL | Status: DC | PRN
  Administered 2017-01-30: 1 via TOPICAL
  Filled 2017-01-30: qty 120

## 2017-01-30 MED ORDER — FENTANYL CITRATE (PF) 100 MCG/2ML IJ SOLN
INTRAMUSCULAR | Status: AC
  Filled 2017-01-30: qty 2

## 2017-01-30 MED ORDER — DIPHENHYDRAMINE HCL 25 MG PO CAPS: 25.0000 mg | ORAL_CAPSULE | Freq: Four times a day (QID) | ORAL | Status: DC | PRN

## 2017-01-30 MED ORDER — PHENYLEPHRINE HCL 10 MG/ML IJ SOLN
INTRAMUSCULAR | Status: AC
  Filled 2017-01-30: qty 1

## 2017-01-30 MED ORDER — BUPIVACAINE 0.25 % ON-Q PUMP DUAL CATH 400 ML
400.0000 mL | INJECTION | Status: DC
  Filled 2017-01-30: qty 400

## 2017-01-30 MED ORDER — LACTATED RINGERS IV SOLN: INTRAVENOUS | Status: DC

## 2017-01-30 MED ORDER — NALBUPHINE HCL 10 MG/ML IJ SOLN: 5.0000 mg | INTRAMUSCULAR | Status: DC | PRN

## 2017-01-30 MED ORDER — KETOROLAC TROMETHAMINE 30 MG/ML IJ SOLN
30.0000 mg | Freq: Four times a day (QID) | INTRAMUSCULAR | Status: DC
  Administered 2017-01-30 (×2): 30 mg via INTRAVENOUS
  Filled 2017-01-30 (×2): qty 1

## 2017-01-30 MED ORDER — KETOROLAC TROMETHAMINE 30 MG/ML IJ SOLN
30.0000 mg | Freq: Four times a day (QID) | INTRAMUSCULAR | Status: AC
  Administered 2017-01-30 – 2017-01-31 (×2): 30 mg via INTRAVENOUS
  Filled 2017-01-30: qty 1

## 2017-01-30 MED ORDER — PHENYLEPHRINE HCL 10 MG/ML IJ SOLN
INTRAMUSCULAR | Status: DC | PRN
  Administered 2017-01-30: 300 ug via INTRAVENOUS
  Administered 2017-01-30: 200 ug via INTRAVENOUS
  Administered 2017-01-30: 100 ug via INTRAVENOUS
  Administered 2017-01-30: 200 ug via INTRAVENOUS
  Administered 2017-01-30: 300 ug via INTRAVENOUS
  Administered 2017-01-30: 100 ug via INTRAVENOUS

## 2017-01-30 MED ORDER — OXYCODONE HCL 5 MG PO TABS
10.0000 mg | ORAL_TABLET | ORAL | Status: DC | PRN
  Administered 2017-01-31: 10 mg via ORAL
  Filled 2017-01-30: qty 2

## 2017-01-30 MED ORDER — ONDANSETRON HCL 4 MG/2ML IJ SOLN: 4.0000 mg | Freq: Three times a day (TID) | INTRAMUSCULAR | Status: DC | PRN

## 2017-01-30 MED ORDER — OXYCODONE HCL 5 MG PO TABS
10.0000 mg | ORAL_TABLET | ORAL | Status: DC | PRN
Start: 2017-01-30 — End: 2017-01-30

## 2017-01-30 MED ORDER — OXYCODONE HCL 5 MG PO TABS
5.0000 mg | ORAL_TABLET | ORAL | Status: DC | PRN
  Administered 2017-01-30 – 2017-01-31 (×3): 5 mg via ORAL
  Filled 2017-01-30 (×4): qty 1

## 2017-01-30 MED ORDER — FENTANYL CITRATE (PF) 100 MCG/2ML IJ SOLN
INTRAMUSCULAR | Status: DC | PRN
  Administered 2017-01-30: 15 ug via INTRATHECAL

## 2017-01-30 MED ORDER — BUPIVACAINE HCL (PF) 0.5 % IJ SOLN
INTRAMUSCULAR | Status: DC | PRN
  Administered 2017-01-30: 5 mL

## 2017-01-30 MED ORDER — PRENATAL MULTIVITAMIN CH
1.0000 | ORAL_TABLET | Freq: Every day | ORAL | Status: DC
  Administered 2017-01-31: 1 via ORAL
  Filled 2017-01-30: qty 1

## 2017-01-30 MED ORDER — BUPIVACAINE HCL (PF) 0.5 % IJ SOLN
10.0000 mL | Freq: Once | INTRAMUSCULAR | Status: DC
  Filled 2017-01-30: qty 30

## 2017-01-30 MED ORDER — WITCH HAZEL-GLYCERIN EX PADS: 1.0000 "application " | MEDICATED_PAD | CUTANEOUS | Status: DC | PRN

## 2017-01-30 MED ORDER — SODIUM CHLORIDE 0.9% FLUSH: 3.0000 mL | INTRAVENOUS | Status: DC | PRN

## 2017-01-30 MED ORDER — LACTATED RINGERS IV SOLN
INTRAVENOUS | Status: DC
  Administered 2017-01-30 (×2): via INTRAVENOUS

## 2017-01-30 MED ORDER — OXYCODONE-ACETAMINOPHEN 5-325 MG PO TABS: 2.0000 | ORAL_TABLET | ORAL | Status: DC | PRN

## 2017-01-30 MED ORDER — MENTHOL 3 MG MT LOZG
1.0000 | LOZENGE | OROMUCOSAL | Status: DC | PRN
Start: 2017-01-30 — End: 2017-02-01

## 2017-01-30 MED ORDER — IBUPROFEN 600 MG PO TABS: 600.0000 mg | ORAL_TABLET | Freq: Four times a day (QID) | ORAL | Status: DC

## 2017-01-30 MED ORDER — OXYCODONE-ACETAMINOPHEN 5-325 MG PO TABS: 1.0000 | ORAL_TABLET | ORAL | Status: DC | PRN

## 2017-01-30 MED ORDER — ONDANSETRON HCL 4 MG/2ML IJ SOLN
INTRAMUSCULAR | Status: DC | PRN
  Administered 2017-01-30: 4 mg via INTRAVENOUS

## 2017-01-30 MED ORDER — DIPHENHYDRAMINE HCL 25 MG PO CAPS: 25.0000 mg | ORAL_CAPSULE | ORAL | Status: DC | PRN

## 2017-01-30 SURGICAL SUPPLY — 28 items
BAG COUNTER SPONGE EZ (MISCELLANEOUS) ×2 IMPLANT
CANISTER SUCT 3000ML PPV (MISCELLANEOUS) ×3 IMPLANT
CATH KIT ON-Q SILVERSOAK 5IN (CATHETERS) ×6 IMPLANT
CHLORAPREP W/TINT 26ML (MISCELLANEOUS) ×6 IMPLANT
CLOSURE WOUND 1/2 X4 (GAUZE/BANDAGES/DRESSINGS) ×1
COUNTER SPONGE BAG EZ (MISCELLANEOUS) ×1
DERMABOND ADVANCED (GAUZE/BANDAGES/DRESSINGS) ×2
DERMABOND ADVANCED .7 DNX12 (GAUZE/BANDAGES/DRESSINGS) ×1 IMPLANT
DRSG OPSITE POSTOP 4X10 (GAUZE/BANDAGES/DRESSINGS) ×3 IMPLANT
DRSG TELFA 3X8 NADH (GAUZE/BANDAGES/DRESSINGS) ×3 IMPLANT
ELECT CAUTERY BLADE 6.4 (BLADE) IMPLANT
ELECT REM PT RETURN 9FT ADLT (ELECTROSURGICAL) ×3
ELECTRODE REM PT RTRN 9FT ADLT (ELECTROSURGICAL) ×1 IMPLANT
GAUZE SPONGE 4X4 12PLY STRL (GAUZE/BANDAGES/DRESSINGS) ×3 IMPLANT
GLOVE BIO SURGEON STRL SZ7 (GLOVE) ×12 IMPLANT
GLOVE INDICATOR 7.5 STRL GRN (GLOVE) ×12 IMPLANT
GOWN STRL REUS W/ TWL LRG LVL3 (GOWN DISPOSABLE) ×3 IMPLANT
GOWN STRL REUS W/TWL LRG LVL3 (GOWN DISPOSABLE) ×6
NS IRRIG 1000ML POUR BTL (IV SOLUTION) ×3 IMPLANT
PACK C SECTION AR (MISCELLANEOUS) ×3 IMPLANT
PAD OB MATERNITY 4.3X12.25 (PERSONAL CARE ITEMS) ×3 IMPLANT
PAD PREP 24X41 OB/GYN DISP (PERSONAL CARE ITEMS) ×3 IMPLANT
STRIP CLOSURE SKIN 1/2X4 (GAUZE/BANDAGES/DRESSINGS) ×2 IMPLANT
SUT MNCRL AB 4-0 PS2 18 (SUTURE) ×3 IMPLANT
SUT PDS AB 1 TP1 96 (SUTURE) ×6 IMPLANT
SUT VIC AB 0 CTX 36 (SUTURE) ×4
SUT VIC AB 0 CTX36XBRD ANBCTRL (SUTURE) ×2 IMPLANT
SUT VIC AB 2-0 CT1 36 (SUTURE) IMPLANT

## 2017-01-30 NOTE — Op Note (Signed)
Preoperative Diagnosis: 1) 31 y.o. Z6X0960 at [redacted]w[redacted]d history of cesarean section 2) Desires permanent surgical sterilization  Postoperative Diagnosis: 1) 31 y.o. A5W0981 at [redacted]w[redacted]d history of cesarean section 2) Desires permanent surgical sterilization  Operation Performed: Repeat low transverse C-section via pfannenstiel skin incision and Pomeroy tubal ligation  Anesthesia: Spinal  Primary Surgeon: Vena Austria, MD  Assistant: Thomasene Mohair, MD  Preoperative Antibiotics: 2g ancef  Estimated Blood Loss:  IV Fluids:  Urine Output::  Drains or Tubes: Foley to gravity drainage, ON-Q catheter system  Implants: none  Specimens Removed: Bilateral portion of fallopian tube  Complications: none  Intraoperative Findings:  Normal tubes ovaries and uterus.  Delivery resulted in the birth of a liveborn female (River), APGAR (1 MIN): 9   APGAR (5 MINS): 9, weight 8lbs 9oz  Patient Condition: stable  Procedure in Detail:  Patient was taken to the operating room were she was administered regional anesthesia.  She was positioned in the supine position, prepped and draped in the  Usual sterile fashion.  Prior to proceeding with the case a time out was performed and the level of anesthetic was checked and noted to be adequate.  Utilizing the scalpel a pfannenstiel skin incision was made 2cm above the pubic symphysis utilizing the patient's pre-existing scar and carried down sharply to the the level of the rectus fascia.  The fascia was incised in the midline using the scalpel and then extended using mayo scissors.  The superior border of the rectus fascia was grasped with two Kocher clamps and the underlying rectus muscles were dissected of the fascia using blunt dissection.  The median raphae was incised using Mayo scissors.   The inferior border of the rectus fascia was dissected of the rectus muscles in a similar fashion.  The midline was identified, the peritoneum was  entered bluntly and expanded using manual tractions.  The uterus was noted to be in a none rotated position.  Next the bladder blade was placed retracting the bladder caudally.  A bladder flap was created.  The bladder reflection was grasped with a pickup, and Metzenbaum scissors were then used the undermine the bladder reflection.  The bladder flap was developed using digital dissection.  The bladder blade was replaced retracting the bladder caudally out of the operative field.  A low transverse incision was scored on the lower uterine segment.  The hysterotomy was entered bluntly using the operators finger.  The hysterotomy incision was extended using manual traction.  The operators hand was placed within the hysterotomy position noting the fetus to be within the OA position.  The vertex was grasped, flexed, brought to the incision, and delivered a traumatically using fundal pressure.  The remainder of the body delivered with ease.  The infant was suctioned, cord was clamped and cut before handing off to the awaiting neonatologist.  The placenta was delivered using manual extraction.  The uterus was exteriorized, wiped clean of clots and debris using two moist laps.  The hysterotomy was closed using a two layer closure of 0 Vicryl, with the first being a running locked, the second a vertical imbricating.  The right tube was grasped in a mid isthmic portion using a Babcock clamp, before being double suture ligated using a 0 chromic wheel.  The intervening nuckel of tube was excised using Metzenbaum scissors.  Complete cross section of tubal ostia visualized, and noted to be hemostatic  This procedure was then repeated in similar fashion for the patient left tube.  The uterus was returned to the abdomen.  The peritoneal gutters were wiped clean of clots and debris using two moist laps.  The hysterotomy incision and tubal pedicles were re-inspected noted to be hemostatic. The rectus muscles were inspected noted  to be hemostatic.  The superior border of the rectus fascia was grasped with a Kocher clamp.  The ON-Q trocars were then placed 4cm above the superior border of the incision and tunneled subfascially.  The introducers were removed and the catheters were threaded through the sleeves after which the sleeves were removed.  On bolus administration, the right catheter did not flush and was removed in it entirety without difficulty noting the black tip. The fascia was closed using a looped #1 PDS in a running fashion taking 1cm by 1cm bites.  The subcutaneous tissue was irrigated using warm saline, hemostasis achieved using the bovie.  The subcutaneous dead space was less than 3cm and was no closed.  The skin was closed using 4-0 monocryl in a subcuticular fashion.  Sponge needle and instrument counts were corrects times two.  The patient tolerated the procedure well and was taken to the recovery room in stable condition.

## 2017-01-30 NOTE — H&P (Signed)
Date of Initial H&P: 01/29/17  History reviewed, patient examined, no change in status, stable for surgery.  

## 2017-01-30 NOTE — Anesthesia Post-op Follow-up Note (Signed)
Anesthesia QCDR form completed.        

## 2017-01-30 NOTE — Anesthesia Preprocedure Evaluation (Signed)
Anesthesia Evaluation  Patient identified by MRN, date of birth, ID band Patient awake    Reviewed: Allergy & Precautions, NPO status , Patient's Chart, lab work & pertinent test results  History of Anesthesia Complications Negative for: history of anesthetic complications  Airway Mallampati: III  TM Distance: >3 FB Neck ROM: Full    Dental no notable dental hx.    Pulmonary neg pulmonary ROS, neg sleep apnea, neg COPD,    breath sounds clear to auscultation- rhonchi (-) wheezing      Cardiovascular Exercise Tolerance: Good (-) hypertension(-) CAD and (-) Past MI  Rhythm:Regular Rate:Normal - Systolic murmurs and - Diastolic murmurs    Neuro/Psych Anxiety negative neurological ROS     GI/Hepatic negative GI ROS, Neg liver ROS,   Endo/Other  negative endocrine ROSneg diabetes  Renal/GU negative Renal ROS     Musculoskeletal negative musculoskeletal ROS (+)   Abdominal (+) - obese, Gravid abdomen  Peds  Hematology  (+) anemia ,   Anesthesia Other Findings Past Medical History: No date: Anemia No date: Anxiety No date: Complication of anesthesia     Comment:  shaking/ headache with spinal No date: Indigestion   Reproductive/Obstetrics (+) Pregnancy                             Anesthesia Physical Anesthesia Plan  ASA: II  Anesthesia Plan: Spinal   Post-op Pain Management:    Induction:   PONV Risk Score and Plan: 2 and Ondansetron  Airway Management Planned: Natural Airway  Additional Equipment:   Intra-op Plan:   Post-operative Plan:   Informed Consent: I have reviewed the patients History and Physical, chart, labs and discussed the procedure including the risks, benefits and alternatives for the proposed anesthesia with the patient or authorized representative who has indicated his/her understanding and acceptance.   Dental advisory given  Plan Discussed with:  Anesthesiologist and CRNA  Anesthesia Plan Comments:         Lab Results  Component Value Date   WBC 6.5 01/29/2017   HGB 9.9 (L) 01/29/2017   HCT 31.1 (L) 01/29/2017   MCV 77.5 (L) 01/29/2017   PLT 209 01/29/2017    Anesthesia Quick Evaluation

## 2017-01-30 NOTE — Anesthesia Procedure Notes (Signed)
Performed by: Berish Bohman Pre-anesthesia Checklist: Patient identified, Emergency Drugs available, Suction available, Patient being monitored and Timeout performed Oxygen Delivery Method: Nasal cannula       

## 2017-01-30 NOTE — Lactation Note (Signed)
This note was copied from a baby's chart. Lactation Consultation Note  Patient Name: Kimberly Jorge NyKathryn Hanna ZOXWR'UToday's Date: 01/30/2017 Reason for consult: Initial assessment   Maternal Data Formula Feeding for Exclusion: No Does the patient have breastfeeding experience prior to this delivery?: Yes  Feeding Feeding Type: Breast Fed  LATCH Score Latch: Too sleepy or reluctant, no latch achieved, no sucking elicited.  Audible Swallowing: None  Type of Nipple: Everted at rest and after stimulation  Comfort (Breast/Nipple): Soft / non-tender  Hold (Positioning): No assistance needed to correctly position infant at breast.  LATCH Score: 6  Interventions Interventions: Breast feeding basics reviewed  Lactation Tools Discussed/Used     Consult Status Consult Status: Follow-up    Trudee GripCarolyn P Marilla Boddy 01/30/2017, 1:48 PM

## 2017-01-30 NOTE — Progress Notes (Signed)
   01/30/17 0700  Clinical Encounter Type  Visited With Patient;Family;Patient and family together  Visit Type Initial;Other (Comment) (Information on HCPOA)  Spiritual Encounters  Spiritual Needs Emotional;Literature  HCPOA/AD materials dropped off with patient.  CH available to review as needed.

## 2017-01-30 NOTE — Transfer of Care (Signed)
Immediate Anesthesia Transfer of Care Note  Patient: Kimberly Hanna  Procedure(s) Performed: Procedure(s): CESAREAN SECTION (N/A)  Patient Location: Mother/Baby  Anesthesia Type:Spinal  Level of Consciousness: awake, alert  and oriented  Airway & Oxygen Therapy: Patient Spontanous Breathing  Post-op Assessment: Report given to RN and Post -op Vital signs reviewed and stable  Post vital signs: Reviewed and stable  Last Vitals:  Vitals:   01/30/17 0726 01/30/17 0922  BP: 126/88 106/70  Pulse: 87 95  Resp: 16 19  Temp: 36.7 C   SpO2:  100%    Last Pain:  Vitals:   01/30/17 0726  TempSrc: Oral  PainSc:          Complications: No apparent anesthesia complications

## 2017-01-30 NOTE — Discharge Summary (Signed)
OB Discharge Summary     Patient Name: Kimberly Hanna DOB: 1985/12/30 MRN: [id number]  Date of admission: 01/30/2017 Delivering MD: Vena Austria   Date of discharge: 02/01/2017  Admitting diagnosis: REPEAT CSECTION Intrauterine pregnancy: [redacted]w[redacted]d     Secondary diagnosis: Undesired fertility  Additional problems: None     Discharge diagnosis: Term Pregnancy Delivered and Anemia (acute blood loss as a result of surgery)                                                                                               Post partum procedures:none  Augmentation: N/A  Complications: None  Hospital course:  Sceduled C/S   31 y.o. yo G3P2002 at [redacted]w[redacted]d was admitted to the hospital 01/30/2017 for scheduled cesarean section with the following indication:Elective Repeat.  Membrane Rupture Time/Date:   ,    Patient delivered a Viable infant.01/30/2017  Details of operation can be found in separate operative note.  Pateint had an uncomplicated postpartum course.  She is ambulating, tolerating a regular diet, passing flatus, and urinating well. Patient is discharged home in stable condition on  02/01/17. She had a fever to 38.1C overnight early on POD#2. Her fever went away without medication. She has no concerning symptoms of infection and no signs noted. She remained afebrile until her discharge.  She was instructed to monitor her temperature at home.          Physical exam  Vitals:   02/01/17 0045 02/01/17 0127 02/01/17 0352 02/01/17 0821  BP:    117/67  Pulse:    94  Resp:    17  Temp: (!) 100.5 F (38.1 C) 98 F (36.7 C) 97.7 F (36.5 C) 97.9 F (36.6 C)  TempSrc: Oral Oral Oral Oral  SpO2:    99%  Weight:      Height:       General: alert, cooperative and no distress Lochia: appropriate Uterine Fundus: firm Incision: Healing well with no significant drainage, Dressing is clean, dry, and intact, no erythema, induration, warmth, and tenderness.  OnQ pump in place with a single  catheter DVT Evaluation: No evidence of DVT seen on physical exam. No cords or calf tenderness. No significant calf/ankle edema. Labs: Lab Results  Component Value Date   WBC 10.8 01/31/2017   HGB 8.1 (L) 01/31/2017   HCT 24.9 (L) 01/31/2017   MCV 76.9 (L) 01/31/2017   PLT 207 01/31/2017   No flowsheet data found.  Discharge instruction: per After Visit Summary and "Baby and Me Booklet".  After visit meds:  Allergies as of 02/01/2017   No Known Allergies     Medication List    TAKE these medications   calcium carbonate 500 MG chewable tablet Commonly known as:  TUMS - dosed in mg elemental calcium Chew 1 tablet by mouth daily.   GAS-X PO Take 1 capsule by mouth as needed.   ibuprofen 600 MG tablet Commonly known as:  ADVIL,MOTRIN Take 1 tablet (600 mg total) by mouth every 6 (six) hours.   oxyCODONE 5 MG immediate release tablet Commonly known as:  Oxy IR/ROXICODONE  Take 2 tablets (10 mg total) by mouth every 6 (six) hours as needed for breakthrough pain.   ranitidine 150 MG tablet Commonly known as:  ZANTAC Take 150 mg by mouth 2 (two) times daily as needed for heartburn.       Diet: routine diet  Activity: Advance as tolerated. Pelvic rest for 6 weeks.    Outpatient follow up:1 week for incision check Follow up Appt:No future appointments. Follow up Visit:No Follow-up on file.  Postpartum contraception: Tubal Ligation  Newborn Data: Live born female  Birth Weight: 8 lb 8.5 oz (3870 g) APGAR: 9, 9  Baby Feeding: Breast Disposition:home with mother   02/01/2017 Thomasene MohairStephen Janifer Gieselman, MD

## 2017-01-31 LAB — CBC
HCT: 24.9 % — ABNORMAL LOW (ref 35.0–47.0)
Hemoglobin: 8.1 g/dL — ABNORMAL LOW (ref 12.0–16.0)
MCH: 24.9 pg — ABNORMAL LOW (ref 26.0–34.0)
MCHC: 32.4 g/dL (ref 32.0–36.0)
MCV: 76.9 fL — ABNORMAL LOW (ref 80.0–100.0)
PLATELETS: 207 10*3/uL (ref 150–440)
RBC: 3.24 MIL/uL — AB (ref 3.80–5.20)
RDW: 17.3 % — ABNORMAL HIGH (ref 11.5–14.5)
WBC: 10.8 10*3/uL (ref 3.6–11.0)

## 2017-01-31 LAB — SURGICAL PATHOLOGY

## 2017-01-31 MED ORDER — IBUPROFEN 600 MG PO TABS
600.0000 mg | ORAL_TABLET | Freq: Four times a day (QID) | ORAL | Status: DC
  Administered 2017-01-31 – 2017-02-01 (×5): 600 mg via ORAL
  Filled 2017-01-31 (×5): qty 1

## 2017-01-31 NOTE — Anesthesia Post-op Follow-up Note (Signed)
  Anesthesia Pain Follow-up Note  Patient: Kimberly Hanna  Day #: 1  Date of Follow-up: 01/31/2017 Time: 7:10 AM  Last Vitals:  Vitals:   01/30/17 2340 01/31/17 0400  BP: 115/76 114/71  Pulse: 79 76  Resp: 20 18  Temp: 36.7 C (!) 36.4 C  SpO2: 100% 100%    Level of Consciousness: alert  Pain: none   Side Effects:None  Catheter Site Exam:clean, dry, no drainage     Plan: D/C from anesthesia care at surgeon's request  Karoline Caldwelleana Talene Glastetter

## 2017-01-31 NOTE — Anesthesia Postprocedure Evaluation (Signed)
Anesthesia Post Note  Patient: Paul HalfKathryn K Darr  Procedure(s) Performed: Procedure(s) (LRB): CESAREAN SECTION (N/A)  Patient location during evaluation: Mother Baby Anesthesia Type: Spinal Level of consciousness: awake, awake and alert and oriented Pain management: pain level controlled Vital Signs Assessment: post-procedure vital signs reviewed and stable Respiratory status: spontaneous breathing Cardiovascular status: blood pressure returned to baseline Postop Assessment: no headache, no backache, adequate PO intake, no signs of nausea or vomiting and spinal receding Anesthetic complications: no     Last Vitals:  Vitals:   01/30/17 2340 01/31/17 0400  BP: 115/76 114/71  Pulse: 79 76  Resp: 20 18  Temp: 36.7 C (!) 36.4 C  SpO2: 100% 100%    Last Pain:  Vitals:   01/31/17 0505  TempSrc:   PainSc: 0-No pain                 Itzia Cunliffe Lawerance CruelStarr

## 2017-01-31 NOTE — Progress Notes (Signed)
  Subjective:   Doing well Post Op day 1. She is tolerating PO intake and pain with PO medications and On Q Pump. She is ambulating and voiding without difficulty. Breastfeeding is going well per patient report. She feels that her breasts are filling.   Objective:  Blood pressure 119/75, pulse 86, temperature 98 F (36.7 C), temperature source Oral, resp. rate 18, height 5\' 6"  (1.676 m), weight 170 lb (77.1 kg), last menstrual period 04/29/2016, SpO2 99 %, currently breastfeeding.  General: NAD Pulmonary: no increased work of breathing Abdomen: non-distended, non-tender, fundus firm at level of umbilicus Incision: Waffle dressing is C/D/I, On Q Pump is intact Extremities: no edema, no erythema, no tenderness  Results for orders placed or performed during the hospital encounter of 01/30/17 (from the past 24 hour(s))  CBC     Status: Abnormal   Collection Time: 01/31/17  6:47 AM  Result Value Ref Range   WBC 10.8 3.6 - 11.0 K/uL   RBC 3.24 (L) 3.80 - 5.20 MIL/uL   Hemoglobin 8.1 (L) 12.0 - 16.0 g/dL   HCT 78.224.9 (L) 95.635.0 - 21.347.0 %   MCV 76.9 (L) 80.0 - 100.0 fL   MCH 24.9 (L) 26.0 - 34.0 pg   MCHC 32.4 32.0 - 36.0 g/dL   RDW 08.617.3 (H) 57.811.5 - 46.914.5 %   Platelets 207 150 - 440 K/uL    Intake/Output Summary (Last 24 hours) at 01/31/17 [id number] Last data filed at 01/31/17 [id number]  Gross per 24 hour  Intake             1052 ml  Output             2203 ml  Net            -1151 ml     Assessment:   31 y.o. L2G4010G3P2002 postoperativeday # 1   Plan:  1) Acute blood loss anemia - hemodynamically stable and asymptomatic - po ferrous sulfate  2) A positive, Rubella Immune, Varicella Immune  3) TDAP status: UTD   4) Breast/Contraception: Tubal Ligation  5) Disposition: discharge to home day 2 or 3  Tresea MallJane Emeree Mahler, CNM

## 2017-02-01 MED ORDER — IBUPROFEN 600 MG PO TABS: 600.0000 mg | ORAL_TABLET | Freq: Four times a day (QID) | ORAL | 0 refills | Status: DC

## 2017-02-01 MED ORDER — OXYCODONE HCL 5 MG PO TABS: 10.0000 mg | ORAL_TABLET | Freq: Four times a day (QID) | ORAL | 0 refills | Status: DC | PRN

## 2017-02-01 NOTE — Progress Notes (Signed)
Pt has remained afebrile ready for d/c.  Discharged to home with nb.  [facility]

## 2017-02-01 NOTE — Progress Notes (Signed)
Discharge inst reviewed with pt.  Rx given for home use. 

## 2017-02-05 ENCOUNTER — Encounter: Payer: Self-pay | Admitting: Obstetrics and Gynecology

## 2017-02-05 ENCOUNTER — Ambulatory Visit (INDEPENDENT_AMBULATORY_CARE_PROVIDER_SITE_OTHER): Payer: BLUE CROSS/BLUE SHIELD | Admitting: Obstetrics and Gynecology

## 2017-02-05 VITALS — BP 104/68 | HR 89 | Wt 154.0 lb

## 2017-02-05 DIAGNOSIS — Z4889 Encounter for other specified surgical aftercare: Secondary | ICD-10-CM

## 2017-02-05 NOTE — Progress Notes (Signed)
      Postoperative Follow-up Patient presents post op from RLTCS & BTL 01/30/17 for prior cesarean section and undesired fertility.  Subjective: Patient reports some improvement in her preop symptoms. Eating a regular diet without difficulty. Pain is controlled with current analgesics. Medications being used: ibuprofen (OTC) and narcotic analgesics including percocet.  Activity: normal activities of daily living.  Objective: There were no vitals filed for this visit.  General: NAD HEENT: normocephalic, anicteric Pulmonary: no increased work of breathing Abdomen: soft, non-tender, non-distended, incision D/C/I Ext: no edema Psychiatric: mood appropriate, affect full  Assessment: 31 y.o. s/p RLTCS and BTL stable  Plan: Patient has done well after surgery with no apparent complications.  I have discussed the post-operative course to date, and the expected progress moving forward.  The patient understands what complications to be concerned about.  I will see the patient in routine follow up, or sooner if needed.   Pap 05/08/15 NIL Activity plan: No heavy lifting.   Kimberly Hanna Kimberly Hanna 02/05/2017, 12:02 AM

## 2017-02-11 ENCOUNTER — Telehealth: Payer: Self-pay

## 2017-02-11 NOTE — Telephone Encounter (Signed)
FMLA/DISABILITY form for [employer] filled out and given to [place] for processing. 

## 2017-02-12 ENCOUNTER — Encounter: Payer: Self-pay | Admitting: Obstetrics and Gynecology

## 2017-02-14 ENCOUNTER — Telehealth: Payer: Self-pay

## 2017-02-14 NOTE — Telephone Encounter (Signed)
Pt delivered 8/9 and had tubes tied.  In the last hour and a half she went from barely bleeding to filling up a pad.  Is concerned.  [id number].  Adv heavier bleeding than normal period flow is normal after delivery and first period after delivery.  However, if saturates pad q51min-1hr, filling it up so it doesn't hold anymore, our policy is for her to go to the ED.  Pt states the back of the pad is dry.  Adv to monitor as it has slowed down al little since she called and reiterated policy.

## 2017-03-04 ENCOUNTER — Encounter (HOSPITAL_COMMUNITY): Payer: Self-pay

## 2017-03-12 ENCOUNTER — Encounter: Payer: Self-pay | Admitting: Obstetrics and Gynecology

## 2017-03-12 ENCOUNTER — Ambulatory Visit (INDEPENDENT_AMBULATORY_CARE_PROVIDER_SITE_OTHER): Payer: BLUE CROSS/BLUE SHIELD | Admitting: Obstetrics and Gynecology

## 2017-03-12 NOTE — Progress Notes (Signed)
Postpartum Visit  Chief Complaint:  Chief Complaint  Patient presents with  . Postpartum Care    History of Present Illness: Patient is a 31 y.o. [id number] presents for postpartum visit.   Review the Delivery Report for details.  Date of delivery: 01/30/17 This patient has no babies on file. Cesarean Section: Elective repeat Pregnancy or labor problems:  no Any problems since the delivery:  no  Newborn Details:  SINGLETON :  1. Baby's name: River. Birth weight: 8lbs 9oz Maternal Details:  Breast Feeding:  yes Post partum depression/anxiety noted:  no Edinburgh Post-Partum Depression Score:  9 Date of last PAP: 05/08/15  normal   Review of Systems: Review of Systems  Constitutional: Negative for chills and fever.  HENT: Negative for congestion.   Respiratory: Negative for cough and shortness of breath.   Cardiovascular: Negative for chest pain and palpitations.  Gastrointestinal: Negative for abdominal pain, constipation, diarrhea, heartburn, nausea and vomiting.  Genitourinary: Negative for dysuria, frequency and urgency.  Skin: Negative for itching and rash.  Neurological: Negative for dizziness and headaches.  Endo/Heme/Allergies: Negative for polydipsia.  Psychiatric/Behavioral: Negative for depression.    The following portions of the patient's history were reviewed and updated as appropriate: allergies, current medications, past family history, past medical history, past social history, past surgical history and problem list.  Past Medical History:  Past Medical History:  Diagnosis Date  . Anemia   . Anxiety   . Complication of anesthesia    shaking/ headache with spinal  . Indigestion     Past Surgical History:  Past Surgical History:  Procedure Laterality Date  . CESAREAN SECTION    . CESAREAN SECTION N/A 04/04/2015   Procedure: CESAREAN SECTION;  Surgeon: Vena Austria, MD;  Location: [facility] ORS;  Service: Obstetrics;  Laterality: N/A;  . CESAREAN  SECTION N/A 01/30/2017   Procedure: CESAREAN SECTION;  Surgeon: Vena Austria, MD;  Location: [facility] ORS;  Service: Obstetrics;  Laterality: N/A;  . TUBAL LIGATION  01/30/2017    Family History:  No family history on file.  Social History:  Social History   Social History  . Marital status: Married    Spouse name: N/A  . Number of children: N/A  . Years of education: N/A   Occupational History  . Not on file.   Social History Main Topics  . Smoking status: Never Smoker  . Smokeless tobacco: Never Used  . Alcohol use No  . Drug use: No  . Sexual activity: Yes   Other Topics Concern  . Not on file   Social History Narrative  . No narrative on file    Allergies:  No Known Allergies  Medications: Prior to Admission medications   Medication Sig Start Date End Date Taking? Authorizing Provider  calcium carbonate (TUMS - DOSED IN MG ELEMENTAL CALCIUM) 500 MG chewable tablet Chew 1 tablet by mouth daily.    [provider]  ibuprofen (ADVIL,MOTRIN) 600 MG tablet Take 1 tablet (600 mg total) by mouth every 6 (six) hours. 02/01/17   Conard Novak, MD  oxyCODONE (OXY IR/ROXICODONE) 5 MG immediate release tablet Take 2 tablets (10 mg total) by mouth every 6 (six) hours as needed for breakthrough pain. 02/01/17   Conard Novak, MD  ranitidine (ZANTAC) 150 MG tablet Take 150 mg by mouth 2 (two) times daily as needed for heartburn.    [provider]  Simethicone (GAS-X PO) Take 1 capsule by mouth as needed.  [provider]    Physical Exam Vitals:  Vitals:   03/12/17 1335  BP: 108/68  Pulse: 92    General: NAD HEENT: normocephalic, anicteric Pulmonary: No increased work of breathing Abdomen: NABS, soft, non-tender, non-distended.  Umbilicus without lesions.  No hepatomegaly, splenomegaly or masses palpable. No evidence of hernia. Incision D/C/I Genitourinary:  External: Normal external female genitalia.  Normal urethral meatus,  normal  Bartholin's and Skene's glands.    Vagina: Normal vaginal mucosa, no evidence of prolapse.    Cervix: Grossly normal in appearance, no bleeding  Uterus: Non-enlarged, mobile, normal contour.  No CMT  Adnexa: ovaries non-enlarged, no adnexal masses  Rectal: deferred Extremities: no edema, erythema, or tenderness Neurologic: Grossly intact Psychiatric: mood appropriate, affect full  Assessment: 31 y.o. [id number] presenting for 6 week postpartum visit  Plan: Problem List Items Addressed This Visit    None    Visit Diagnoses    Encounter for postpartum visit    -  Primary       1) Contraception - s/p BTL  2)  Pap - ASCCP guidelines and rational discussed.  Patient opts for 3 year screening interval  3) Patient underwent screening for postpartum depression with no concerns noted.  4) Follow up 1 year for routine annual exam

## 2019-04-14 ENCOUNTER — Encounter: Payer: Self-pay | Admitting: Obstetrics and Gynecology

## 2019-04-14 ENCOUNTER — Other Ambulatory Visit: Payer: Self-pay

## 2019-04-14 ENCOUNTER — Ambulatory Visit (INDEPENDENT_AMBULATORY_CARE_PROVIDER_SITE_OTHER): Payer: Self-pay | Admitting: Obstetrics and Gynecology

## 2019-04-14 ENCOUNTER — Other Ambulatory Visit (HOSPITAL_COMMUNITY)
Admission: RE | Admit: 2019-04-14 | Discharge: 2019-04-14 | Disposition: A | Discharge status: Home / Self Care | Payer: Self-pay | Source: Ambulatory Visit | Attending: Obstetrics and Gynecology | Admitting: Obstetrics and Gynecology

## 2019-04-14 VITALS — BP 134/84 | HR 85 | Ht 65.0 in | Wt 164.0 lb

## 2019-04-14 DIAGNOSIS — Z124 Encounter for screening for malignant neoplasm of cervix: Secondary | ICD-10-CM

## 2019-04-14 DIAGNOSIS — Z30011 Encounter for initial prescription of contraceptive pills: Secondary | ICD-10-CM

## 2019-04-14 DIAGNOSIS — Z01419 Encounter for gynecological examination (general) (routine) without abnormal findings: Secondary | ICD-10-CM

## 2019-04-14 DIAGNOSIS — Z1239 Encounter for other screening for malignant neoplasm of breast: Secondary | ICD-10-CM

## 2019-04-14 DIAGNOSIS — N939 Abnormal uterine and vaginal bleeding, unspecified: Secondary | ICD-10-CM

## 2019-04-14 MED ORDER — NORGESTIM-ETH ESTRAD TRIPHASIC 0.18/0.215/0.25 MG-25 MCG PO TABS: 1.0000 | ORAL_TABLET | Freq: Every day | ORAL | 3 refills | Status: DC

## 2019-04-14 NOTE — Progress Notes (Signed)
Gynecology Annual Exam   PCP: [facility]  Chief Complaint:  Chief Complaint  Patient presents with  . Gynecologic Exam    irregular bleeding/heavy    History of Present Illness: Patient is a 33 y.o. [id number] presents for annual exam. The patient has no complaints today.   LMP: Patient's last menstrual period was 03/16/2019 (exact date). Average Interval: regular monthly Duration of flow: 7-30 days Heavy Menses: Yes first 3 days Clots: no Intermenstrual Bleeding: no, on longer cycles will intermittently stop a few days or go from bright red to brown Postcoital Bleeding: no Dysmenorrhea: no  The patient is sexually active. She currently uses tubal ligation for contraception. She denies dyspareunia.  There is no notable family history of breast or ovarian cancer in her family.  The patient wears seatbelts: yes.   The patient has regular exercise: not asked.    The patient denies current symptoms of depression.    Review of Systems: Review of Systems  Constitutional: Negative for chills and fever.  HENT: Negative for congestion.   Respiratory: Negative for cough and shortness of breath.   Cardiovascular: Negative for chest pain and palpitations.  Gastrointestinal: Negative for abdominal pain, constipation, diarrhea, heartburn, nausea and vomiting.  Genitourinary: Negative for dysuria, frequency and urgency.  Skin: Negative for itching and rash.  Neurological: Negative for dizziness and headaches.  Endo/Heme/Allergies: Negative for polydipsia.  Psychiatric/Behavioral: Negative for depression.    Past Medical History:  Past Medical History:  Diagnosis Date  . Anemia   . Anxiety   . Complication of anesthesia    shaking/ headache with spinal  . Indigestion     Past Surgical History:  Past Surgical History:  Procedure Laterality Date  . CESAREAN SECTION    . CESAREAN SECTION N/A 04/04/2015   Procedure: CESAREAN SECTION;  Surgeon: Vena AustriaAndreas  Ronit Cranfield, MD;  Location: [facility] ORS;  Service: Obstetrics;  Laterality: N/A;  . CESAREAN SECTION N/A 01/30/2017   Procedure: CESAREAN SECTION;  Surgeon: Vena AustriaStaebler, Jeslyn Amsler, MD;  Location: [facility] ORS;  Service: Obstetrics;  Laterality: N/A;  . TUBAL LIGATION  01/30/2017    Gynecologic History:  Patient's last menstrual period was 03/16/2019 (exact date). Contraception: tubal ligation Last Pap: Results were:05/08/2015 no abnormalities   Obstetric History: [id number]  Family History:  History reviewed. No pertinent family history.  Social History:  Social History   Socioeconomic History  . Marital status: Married    Spouse name: Not on file  . Number of children: Not on file  . Years of education: Not on file  . Highest education level: Not on file  Occupational History  . Not on file  Social Needs  . Financial resource strain: Not on file  . Food insecurity    Worry: Not on file    Inability: Not on file  . Transportation needs    Medical: Not on file    Non-medical: Not on file  Tobacco Use  . Smoking status: Never Smoker  . Smokeless tobacco: Never Used  Substance and Sexual Activity  . Alcohol use: No  . Drug use: No  . Sexual activity: Yes  Lifestyle  . Physical activity    Days per week: Not on file    Minutes per session: Not on file  . Stress: Not on file  Relationships  . Social Musicianconnections    Talks on phone: Not on file    Gets together: Not on file    Attends religious service: Not on file  Active member of club or organization: Not on file    Attends meetings of clubs or organizations: Not on file    Relationship status: Not on file  . Intimate partner violence    Fear of current or ex partner: Not on file    Emotionally abused: Not on file    Physically abused: Not on file    Forced sexual activity: Not on file  Other Topics Concern  . Not on file  Social History Narrative  . Not on file    Allergies:  No Known Allergies  Medications: Prior to  Admission medications   Medication Sig Start Date End Date Taking? Authorizing Provider  calcium carbonate (TUMS - DOSED IN MG ELEMENTAL CALCIUM) 500 MG chewable tablet Chew 1 tablet by mouth daily.    [provider]  ibuprofen (ADVIL,MOTRIN) 600 MG tablet Take 1 tablet (600 mg total) by mouth every 6 (six) hours. 02/01/17   Will Bonnet, MD  oxyCODONE (OXY IR/ROXICODONE) 5 MG immediate release tablet Take 2 tablets (10 mg total) by mouth every 6 (six) hours as needed for breakthrough pain. 02/01/17   Will Bonnet, MD  ranitidine (ZANTAC) 150 MG tablet Take 150 mg by mouth 2 (two) times daily as needed for heartburn.    [provider]  Simethicone (GAS-X PO) Take 1 capsule by mouth as needed.    [provider]    Physical Exam Vitals: Blood pressure 134/84, pulse 85, height 5\' 5"  (1.651 m), weight 164 lb (74.4 kg), last menstrual period 03/16/2019, not currently breastfeeding.   General: NAD HEENT: normocephalic, anicteric Thyroid: no enlargement, no palpable nodules Pulmonary: No increased work of breathing, CTAB Cardiovascular: RRR, distal pulses 2+ Breast: Breast symmetrical, no tenderness, no palpable nodules or masses, no skin or nipple retraction present, no nipple discharge.  No axillary or supraclavicular lymphadenopathy. Abdomen: NABS, soft, non-tender, non-distended.  Umbilicus without lesions.  No hepatomegaly, splenomegaly or masses palpable. No evidence of hernia  Genitourinary:  External: Normal external female genitalia.  Normal urethral meatus, normal Bartholin's and Skene's glands.    Vagina: Normal vaginal mucosa, no evidence of prolapse.    Cervix: Grossly normal in appearance, no bleeding  Uterus: Non-enlarged, mobile, normal contour.  No CMT  Adnexa: ovaries non-enlarged, no adnexal masses  Rectal: deferred  Lymphatic: no evidence of inguinal lymphadenopathy Extremities: no edema, erythema, or tenderness Neurologic: Grossly  intact Psychiatric: mood appropriate, affect full  Female chaperone present for pelvic and breast  portions of the physical exam    Assessment: 33 y.o. G3P3003 routine annual exam  Plan: Problem List Items Addressed This Visit    None    Visit Diagnoses    Encounter for gynecological examination without abnormal finding    -  Primary   Screening for malignant neoplasm of cervix       Relevant Orders   Cytology - PAP   Breast screening       Abnormal uterine bleeding       Relevant Orders   TSH   Prolactin   Initiation of oral contraception          1) STI screening  was notoffered and therefore not obtained  2)  ASCCP guidelines and rational discussed.  Patient opts for every 3 years screening interval  3) Contraception - the patient is currently using  tubal ligation.  She is happy with her current form of contraception and plans to continue - given AUB would like to restart Tri-sprintec lo  was on previously - TSH and prolactin today - phone visit follow up in 2 months to assess response  4) Routine healthcare maintenance including cholesterol, diabetes screening discussed managed by PCP  5) Return in about 2 months (around 06/14/2019) for medication follow up phone.   Vena Austria, MD, [facility], [facility] 04/14/2019, 4:21 PM

## 2019-04-15 ENCOUNTER — Other Ambulatory Visit: Payer: Self-pay | Admitting: Obstetrics and Gynecology

## 2019-04-15 LAB — TSH: TSH: 1.68 u[IU]/mL (ref 0.450–4.500)

## 2019-04-15 LAB — PROLACTIN: Prolactin: 6.6 ng/mL (ref 4.8–23.3)

## 2019-04-15 MED ORDER — NORGESTIM-ETH ESTRAD TRIPHASIC 0.18/0.215/0.25 MG-25 MCG PO TABS: 1.0000 | ORAL_TABLET | Freq: Every day | ORAL | 11 refills | Status: DC

## 2019-04-15 MED ORDER — NORGESTIM-ETH ESTRAD TRIPHASIC 0.18/0.215/0.25 MG-25 MCG PO TABS: 1.0000 | ORAL_TABLET | Freq: Every day | ORAL | 3 refills | Status: DC

## 2019-04-19 ENCOUNTER — Other Ambulatory Visit: Payer: Self-pay

## 2019-04-19 MED ORDER — NORGESTIM-ETH ESTRAD TRIPHASIC 0.18/0.215/0.25 MG-25 MCG PO TABS: 1.0000 | ORAL_TABLET | Freq: Every day | ORAL | 3 refills | Status: DC

## 2019-04-21 LAB — CYTOLOGY - PAP
Adequacy: ABSENT
Comment: NEGATIVE
Diagnosis: NEGATIVE
High risk HPV: NEGATIVE

## 2019-06-23 ENCOUNTER — Other Ambulatory Visit: Payer: Self-pay

## 2019-06-23 ENCOUNTER — Ambulatory Visit (INDEPENDENT_AMBULATORY_CARE_PROVIDER_SITE_OTHER): Payer: Self-pay | Admitting: Obstetrics and Gynecology

## 2019-06-23 ENCOUNTER — Encounter: Payer: Self-pay | Admitting: Obstetrics and Gynecology

## 2019-06-23 DIAGNOSIS — Z3041 Encounter for surveillance of contraceptive pills: Secondary | ICD-10-CM

## 2019-06-23 NOTE — Progress Notes (Signed)
I connected with Kimberly Hanna on 07/02/19 at  2:10 PM EST by telephone and verified that I am speaking with the correct person using two identifiers.   I discussed the limitations, risks, security and privacy concerns of performing an evaluation and management service by telephone and the availability of in person appointments. I also discussed with the patient that there may be a patient responsible charge related to this service. The patient expressed understanding and agreed to proceed.  The patient was at home I spoke with the patient from my workstation phone The names of people involved in this encounter were: Kimberly Hanna , and Vena Austria   Obstetrics & Gynecology Office Visit   Chief Complaint:  Chief Complaint  Patient presents with  . Follow-up    OCP    History of Present Illness: 33 y.o. [id number] presenting for medication follow up for a diagnosis of AUB.  She is currently being managed with Orth-tri-sprintec.   The patient reports good control of symptoms on her current regimen.  On her current medication regimen achieved good cycle control.   She has not noted any side-effects or new symptoms.    Review of Systems: Review of Systems  Constitutional: Negative.   Gastrointestinal: Negative for nausea.  Genitourinary: Negative.   Neurological: Negative for headaches.     Past Medical History:  Past Medical History:  Diagnosis Date  . Anemia   . Anxiety   . Complication of anesthesia    shaking/ headache with spinal  . Indigestion     Past Surgical History:  Past Surgical History:  Procedure Laterality Date  . CESAREAN SECTION    . CESAREAN SECTION N/A 04/04/2015   Procedure: CESAREAN SECTION;  Surgeon: Vena Austria, MD;  Location: [facility] ORS;  Service: Obstetrics;  Laterality: N/A;  . CESAREAN SECTION N/A 01/30/2017   Procedure: CESAREAN SECTION;  Surgeon: Vena Austria, MD;  Location: [facility] ORS;  Service: Obstetrics;  Laterality: N/A;   . TUBAL LIGATION  01/30/2017    Gynecologic History: Patient's last menstrual period was 06/13/2019 (exact date).  Obstetric History: [id number]  Family History:  History reviewed. No pertinent family history.  Social History:  Social History   Socioeconomic History  . Marital status: Married    Spouse name: Not on file  . Number of children: Not on file  . Years of education: Not on file  . Highest education level: Not on file  Occupational History  . Not on file  Tobacco Use  . Smoking status: Never Smoker  . Smokeless tobacco: Never Used  Substance and Sexual Activity  . Alcohol use: No  . Drug use: No  . Sexual activity: Yes  Other Topics Concern  . Not on file  Social History Narrative  . Not on file   Social Determinants of Health   Financial Resource Strain:   . Difficulty of Paying Living Expenses: Not on file  Food Insecurity:   . Worried About Programme researcher, broadcasting/film/video in the Last Year: Not on file  . Ran Out of Food in the Last Year: Not on file  Transportation Needs:   . Lack of Transportation (Medical): Not on file  . Lack of Transportation (Non-Medical): Not on file  Physical Activity:   . Days of Exercise per Week: Not on file  . Minutes of Exercise per Session: Not on file  Stress:   . Feeling of Stress : Not on file  Social Connections:   . Frequency of Communication  with Friends and Family: Not on file  . Frequency of Social Gatherings with Friends and Family: Not on file  . Attends Religious Services: Not on file  . Active Member of Clubs or Organizations: Not on file  . Attends Archivist Meetings: Not on file  . Marital Status: Not on file  Intimate Partner Violence:   . Fear of Current or Ex-Partner: Not on file  . Emotionally Abused: Not on file  . Physically Abused: Not on file  . Sexually Abused: Not on file    Allergies:  No Known Allergies  Medications: Prior to Admission medications   Medication Sig Start Date End Date  Taking? Authorizing Provider  Norgestimate-Ethinyl Estradiol Triphasic 0.18/0.215/0.25 MG-25 MCG tab Take 1 tablet by mouth daily. 04/19/19   Malachy Mood, MD    Physical Exam Vitals: There were no vitals filed for this visit. Patient's last menstrual period was 06/13/2019 (exact date).  No physical exam as this was a remote telephone visit to promote social distancing during the current COVID-19 Pandemic  Assessment: 33 y.o. [id number] medication follow up  Plan: Problem List Items Addressed This Visit    None    Visit Diagnoses    Surveillance for birth control, oral contraceptives    -  Primary     - good response to ortho-tri-cyclen, initally some headaches.  Discussed potential etiolgoies including adenomyosis given history of cesarean sections  Telephone time 7:42  Malachy Mood, MD, [facility], [facility] 06/23/2019, 2:53 PM

## 2020-04-19 ENCOUNTER — Ambulatory Visit (INDEPENDENT_AMBULATORY_CARE_PROVIDER_SITE_OTHER): Payer: Self-pay | Admitting: Obstetrics and Gynecology

## 2020-04-19 ENCOUNTER — Other Ambulatory Visit: Payer: Self-pay

## 2020-04-19 ENCOUNTER — Encounter: Payer: Self-pay | Admitting: Obstetrics and Gynecology

## 2020-04-19 VITALS — BP 120/80 | Ht 65.0 in | Wt 156.0 lb

## 2020-04-19 DIAGNOSIS — Z3041 Encounter for surveillance of contraceptive pills: Secondary | ICD-10-CM

## 2020-04-19 DIAGNOSIS — Z01419 Encounter for gynecological examination (general) (routine) without abnormal findings: Secondary | ICD-10-CM

## 2020-04-19 DIAGNOSIS — N644 Mastodynia: Secondary | ICD-10-CM

## 2020-04-19 DIAGNOSIS — Z1239 Encounter for other screening for malignant neoplasm of breast: Secondary | ICD-10-CM

## 2020-04-19 MED ORDER — NORGESTIM-ETH ESTRAD TRIPHASIC 0.18/0.215/0.25 MG-25 MCG PO TABS
1.0000 | ORAL_TABLET | Freq: Every day | ORAL | 3 refills | Status: DC
Start: 2020-04-19 — End: 2021-02-22

## 2020-04-19 NOTE — Progress Notes (Signed)
Gynecology Annual Exam   PCP: Center, [facility]  Chief Complaint:  Chief Complaint  Patient presents with  . Gynecologic Exam    History of Present Illness: Patient is a 34 y.o. G3P3003 presents for annual exam. The patient has no complaints today.   LMP: Patient's last menstrual period was 04/14/2020. Average Interval: regular monthly Duration of flow: 5 days Heavy Menses: no Clots: no Intermenstrual Bleeding: no Postcoital Bleeding: no Dysmenorrhea: no  The patient is sexually active. She currently uses BTL/ OCP (estrogen/progesterone) for contraception. She denies dyspareunia.  The patient does perform self breast exams.  Left breast tenderness and felt lump behind nipple.  Friend recently diagnosed with breast cancer in her 30s. There is no notable family history of breast or ovarian cancer in her paternal side, does not know history of biological mother.  The patient wears seatbelts: yes.   The patient has regular exercise: not asked.    The patient denies current symptoms of depression.     Review of Systems: Review of Systems  Constitutional: Negative for chills and fever.  HENT: Negative for congestion.   Respiratory: Negative for cough and shortness of breath.   Cardiovascular: Negative for chest pain and palpitations.  Gastrointestinal: Negative for abdominal pain, constipation, diarrhea, heartburn, nausea and vomiting.  Genitourinary: Negative for dysuria, frequency and urgency.  Skin: Negative for itching and rash.  Neurological: Negative for dizziness and headaches.  Endo/Heme/Allergies: Negative for polydipsia.  Psychiatric/Behavioral: Negative for depression.    Past Medical History: Healthy adult  Past Surgical History:  Past Surgical History:  Procedure Laterality Date  . CESAREAN SECTION    . CESAREAN SECTION N/A 04/04/2015   Procedure: CESAREAN SECTION;  Surgeon: Vena Austria, MD;  Location: [facility] ORS;  Service: Obstetrics;   Laterality: N/A;  . CESAREAN SECTION N/A 01/30/2017   Procedure: CESAREAN SECTION;  Surgeon: Vena Austria, MD;  Location: [facility] ORS;  Service: Obstetrics;  Laterality: N/A;  . TUBAL LIGATION  01/30/2017    Gynecologic History:  Patient's last menstrual period was 04/14/2020. Contraception: tubal ligation OCP for cycle control Last Pap: Results were:04/13/2020 NIL and HR HPV negative   Obstetric History: [id number]  Family History:  History reviewed. No pertinent family history.  Social History:  Social History   Socioeconomic History  . Marital status: Married    Spouse name: Not on file  . Number of children: Not on file  . Years of education: Not on file  . Highest education level: Not on file  Occupational History  . Not on file  Tobacco Use  . Smoking status: Never Smoker  . Smokeless tobacco: Never Used  Vaping Use  . Vaping Use: Never used  Substance and Sexual Activity  . Alcohol use: No  . Drug use: No  . Sexual activity: Yes  Other Topics Concern  . Not on file  Social History Narrative  . Not on file   Social Determinants of Health   Financial Resource Strain:   . Difficulty of Paying Living Expenses: Not on file  Food Insecurity:   . Worried About Programme researcher, broadcasting/film/video in the Last Year: Not on file  . Ran Out of Food in the Last Year: Not on file  Transportation Needs:   . Lack of Transportation (Medical): Not on file  . Lack of Transportation (Non-Medical): Not on file  Physical Activity:   . Days of Exercise per Week: Not on file  . Minutes of Exercise per Session: Not  on file  Stress:   . Feeling of Stress : Not on file  Social Connections:   . Frequency of Communication with Friends and Family: Not on file  . Frequency of Social Gatherings with Friends and Family: Not on file  . Attends Religious Services: Not on file  . Active Member of Clubs or Organizations: Not on file  . Attends Banker Meetings: Not on file  . Marital  Status: Not on file  Intimate Partner Violence:   . Fear of Current or Ex-Partner: Not on file  . Emotionally Abused: Not on file  . Physically Abused: Not on file  . Sexually Abused: Not on file    Allergies:  No Known Allergies  Medications: Prior to Admission medications   Medication Sig Start Date End Date Taking? Authorizing Provider  Norgestimate-Ethinyl Estradiol Triphasic 0.18/0.215/0.25 MG-25 MCG tab Take 1 tablet by mouth daily. 04/19/19  Yes Vena Austria, MD    Physical Exam Vitals: Blood pressure 120/80, height 5\' 5"  (1.651 m), weight 156 lb (70.8 kg), last menstrual period 04/14/2020.  General: NAD HEENT: normocephalic, anicteric Thyroid: no enlargement, no palpable nodules Pulmonary: No increased work of breathing, CTAB Cardiovascular: RRR, distal pulses 2+ Breast: Breast symmetrical, no tenderness, no palpable nodules or masses, no skin or nipple retraction present, no nipple discharge.  No axillary or supraclavicular lymphadenopathy. Abdomen: NABS, soft, non-tender, non-distended.  Umbilicus without lesions.  No hepatomegaly, splenomegaly or masses palpable. No evidence of hernia  Genitourinary:  External: Normal external female genitalia.  Normal urethral meatus, normal Bartholin's and Skene's glands.    Vagina: Normal vaginal mucosa, no evidence of prolapse.    Cervix: Grossly normal in appearance, no bleeding  Uterus: Non-enlarged, mobile, normal contour.  No CMT  Adnexa: ovaries non-enlarged, no adnexal masses  Rectal: deferred  Lymphatic: no evidence of inguinal lymphadenopathy Extremities: no edema, erythema, or tenderness Neurologic: Grossly intact Psychiatric: mood appropriate, affect full  Female chaperone present for pelvic and breast  portions of the physical exam  Immunization History  Administered Date(s) Administered  . Tdap 12/16/2016     Assessment: 34 y.o. G3P3003 routine annual exam  Plan: Problem List Items Addressed This Visit     None    Visit Diagnoses    Encounter for gynecological examination without abnormal finding    -  Primary   Breast screening       Surveillance for birth control, oral contraceptives       Mastodynia of left breast          1) STI screening  was notoffered and therefore not obtained  2)  ASCCP guidelines and rational discussed.  Patient opts for every 3 years screening interval  3) Contraception - the patient is currently using  OCP (estrogen/progesterone).  She is happy with her current form of contraception and plans to continue  4) Routine healthcare maintenance including cholesterol, diabetes screening discussed managed by PCP  5) Breast lump - normal exam today, will repeat clinical breast exam in 6 weeks, if any appreciable changes or new symptoms will proceed with mammogram and ultrasound. In the meantime well fitting bra, may use warm compress to are or NSAIDs for any tenderness.  6) Return in about 6 weeks (around 05/31/2020) for clinical breast exam.   14/01/2020, MD, Vena Austria OB/GYN, [facility] 04/19/2020, 8:17 AM

## 2020-07-03 ENCOUNTER — Other Ambulatory Visit: Payer: Self-pay

## 2020-07-03 DIAGNOSIS — Z20822 Contact with and (suspected) exposure to covid-19: Secondary | ICD-10-CM

## 2020-07-04 ENCOUNTER — Other Ambulatory Visit: Payer: Self-pay

## 2020-07-06 LAB — NOVEL CORONAVIRUS, NAA: SARS-CoV-2, NAA: NOT DETECTED

## 2021-02-22 ENCOUNTER — Other Ambulatory Visit: Payer: Self-pay | Admitting: Obstetrics and Gynecology

## 2021-06-04 ENCOUNTER — Other Ambulatory Visit: Payer: Self-pay | Admitting: Obstetrics and Gynecology

## 2021-06-05 ENCOUNTER — Other Ambulatory Visit: Payer: Self-pay | Admitting: Obstetrics and Gynecology

## 2021-06-05 MED ORDER — NORGESTIM-ETH ESTRAD TRIPHASIC 0.18/0.215/0.25 MG-25 MCG PO TABS: 1.0000 | ORAL_TABLET | Freq: Every day | ORAL | 0 refills | Status: DC

## 2021-06-05 NOTE — Telephone Encounter (Signed)
Patient is scheduled for 07/03/21 with ABC.

## 2021-07-03 ENCOUNTER — Ambulatory Visit: Payer: Self-pay | Admitting: Obstetrics and Gynecology

## 2021-07-31 ENCOUNTER — Ambulatory Visit (INDEPENDENT_AMBULATORY_CARE_PROVIDER_SITE_OTHER): Payer: Self-pay | Admitting: Obstetrics and Gynecology

## 2021-07-31 ENCOUNTER — Encounter: Payer: Self-pay | Admitting: Obstetrics and Gynecology

## 2021-07-31 ENCOUNTER — Other Ambulatory Visit: Payer: Self-pay

## 2021-07-31 VITALS — BP 110/70 | Ht 65.0 in | Wt 152.0 lb

## 2021-07-31 DIAGNOSIS — Z01419 Encounter for gynecological examination (general) (routine) without abnormal findings: Secondary | ICD-10-CM

## 2021-07-31 DIAGNOSIS — Z3041 Encounter for surveillance of contraceptive pills: Secondary | ICD-10-CM

## 2021-07-31 DIAGNOSIS — R1033 Periumbilical pain: Secondary | ICD-10-CM

## 2021-07-31 MED ORDER — NORGESTIM-ETH ESTRAD TRIPHASIC 0.18/0.215/0.25 MG-25 MCG PO TABS: 1.0000 | ORAL_TABLET | Freq: Every day | ORAL | 3 refills | Status: DC

## 2021-07-31 NOTE — Patient Instructions (Signed)
I value your feedback and you entrusting us with your care. If you get a [facility] patient survey, I would appreciate you taking the time to let us know about your experience today. Thank you! ? ? ?

## 2021-07-31 NOTE — Progress Notes (Addendum)
PCP:  Center, [facility]   Chief Complaint  Patient presents with   Gynecologic Exam    Pain on left side beside belly button x couple of months     HPI:      Ms. Kimberly Hanna is a 36 y.o. [id number] whose LMP was Patient's last menstrual period was 07/02/2021 (approximate)., presents today for her annual examination.  Her menses are regular every 28-30 days, lasting 4-5 days, either light flow for a few days or heavy a couple days; on OCPs for cycle control after TL.  Dysmenorrhea none. She does not have intermenstrual bleeding.  Sex activity: single partner, contraception - tubal ligation and OCP (estrogen/progesterone). No pain/bleeding.  Last Pap: 04/14/19 Results were: no abnormalities /neg HPV DNA.  No hx of abn paps.   Has had intermittent achy pain, feels "sore", on LT side/periumbilical area for past few months. Not related to menses, no bulges/masses. Not exercising. Has BM daily, no constipation. Sometimes feels like trapped gas. S/p 3 C/S.  There is no FH of breast cancer. There is no FH of ovarian cancer. The patient does occas do self-breast exams. Has occas LT breast tenderness.   Tobacco use: The patient denies current or previous tobacco use. Alcohol use: few wkly No drug use.  Exercise: not active  She does not get adequate calcium and Vitamin D in her diet.  Past Medical History:  Diagnosis Date   Anemia    Anxiety    Complication of anesthesia    shaking/ headache with spinal   Indigestion      Past Surgical History:  Procedure Laterality Date   CESAREAN SECTION     CESAREAN SECTION N/A 04/04/2015   Procedure: CESAREAN SECTION;  Surgeon: Malachy Mood, MD;  Location: [facility] ORS;  Service: Obstetrics;  Laterality: N/A;   CESAREAN SECTION N/A 01/30/2017   Procedure: CESAREAN SECTION;  Surgeon: Malachy Mood, MD;  Location: [facility] ORS;  Service: Obstetrics;  Laterality: N/A;   TUBAL LIGATION  01/30/2017    History reviewed. No  pertinent family history.  Social History   Socioeconomic History   Marital status: Married    Spouse name: Not on file   Number of children: Not on file   Years of education: Not on file   Highest education level: Not on file  Occupational History   Not on file  Tobacco Use   Smoking status: Never   Smokeless tobacco: Never  Vaping Use   Vaping Use: Never used  Substance and Sexual Activity   Alcohol use: No   Drug use: No   Sexual activity: Yes    Birth control/protection: Pill  Other Topics Concern   Not on file  Social History Narrative   Not on file   Social Determinants of Health   Financial Resource Strain: Not on file  Food Insecurity: Not on file  Transportation Needs: Not on file  Physical Activity: Not on file  Stress: Not on file  Social Connections: Not on file  Intimate Partner Violence: Not on file     Current Outpatient Medications:    Norgestimate-Ethinyl Estradiol Triphasic (TRI-VYLIBRA LO) 0.18/0.215/0.25 MG-25 MCG tab, Take 1 tablet by mouth daily., Disp: 84 tablet, Rfl: 3     ROS:  Review of Systems  Constitutional:  Negative for fatigue, fever and unexpected weight change.  Respiratory:  Negative for cough, shortness of breath and wheezing.   Cardiovascular:  Negative for chest pain, palpitations and leg swelling.  Gastrointestinal:  Positive  for abdominal pain. Negative for blood in stool, constipation, diarrhea, nausea and vomiting.  Endocrine: Negative for cold intolerance, heat intolerance and polyuria.  Genitourinary:  Negative for dyspareunia, dysuria, flank pain, frequency, genital sores, hematuria, menstrual problem, pelvic pain, urgency, vaginal bleeding, vaginal discharge and vaginal pain.  Musculoskeletal:  Negative for back pain, joint swelling and myalgias.  Skin:  Negative for rash.  Neurological:  Negative for dizziness, syncope, light-headedness, numbness and headaches.  Hematological:  Negative for adenopathy.   Psychiatric/Behavioral:  Negative for agitation, confusion, sleep disturbance and suicidal ideas. The patient is not nervous/anxious.   BREAST: No symptoms   Objective: BP 110/70    Ht 5\' 5"  (1.651 m)    Wt 152 lb (68.9 kg)    LMP 07/02/2021 (Approximate)    BMI 25.29 kg/m    Physical Exam Constitutional:      Appearance: She is well-developed.  Genitourinary:     Vulva normal.     Right Labia: No rash, tenderness or lesions.    Left Labia: No tenderness, lesions or rash.    No vaginal discharge, erythema or tenderness.      Right Adnexa: not tender and no mass present.    Left Adnexa: not tender and no mass present.    No cervical friability or polyp.     Uterus is not enlarged or tender.  Breasts:    Right: No mass, nipple discharge, skin change or tenderness.     Left: No mass, nipple discharge, skin change or tenderness.  Neck:     Thyroid: No thyromegaly.  Cardiovascular:     Rate and Rhythm: Normal rate and regular rhythm.     Heart sounds: Normal heart sounds. No murmur heard. Pulmonary:     Effort: Pulmonary effort is normal.     Breath sounds: Normal breath sounds.  Abdominal:     Palpations: Abdomen is soft.     Tenderness: There is no abdominal tenderness. There is no guarding or rebound.    Musculoskeletal:        General: Normal range of motion.     Cervical back: Normal range of motion.  Lymphadenopathy:     Cervical: No cervical adenopathy.  Neurological:     General: No focal deficit present.     Mental Status: She is alert and oriented to person, place, and time.     Cranial Nerves: No cranial nerve deficit.  Skin:    General: Skin is warm and dry.  Psychiatric:        Mood and Affect: Mood normal.        Behavior: Behavior normal.        Thought Content: Thought content normal.        Judgment: Judgment normal.  Vitals reviewed.    Assessment/Plan: Encounter for annual routine gynecological examination  Encounter for surveillance of  contraceptive pills - Plan: Norgestimate-Ethinyl Estradiol Triphasic (TRI-VYLIBRA LO) 0.18/0.215/0.25 MG-25 MCG tab; OCP RF. Also discussed IUD/ablation for period control. Pt wants to cont OCPs for now.   Periumbilical pain--LT side, neg exam. Follow expectantly, keep diary. Can check abd u/s prn. ? Scar tissue.   Meds ordered this encounter  Medications   Norgestimate-Ethinyl Estradiol Triphasic (TRI-VYLIBRA LO) 0.18/0.215/0.25 MG-25 MCG tab    Sig: Take 1 tablet by mouth daily.    Dispense:  84 tablet    Refill:  3    Order Specific Question:   Supervising Provider    Answer:   Nadara Mustard [[id number]]  GYN counsel adequate intake of calcium and vitamin D, diet and exercise     F/U  Return in about 1 year (around 07/31/2022).  Kyrsten Deleeuw B. Django Nguyen, PA-C 07/31/2021 4:31 PM

## 2022-09-26 ENCOUNTER — Ambulatory Visit
Admission: RE | Admit: 2022-09-26 | Discharge: 2022-09-26 | Disposition: A | Discharge status: Home / Self Care | Payer: 59 | Source: Ambulatory Visit

## 2022-09-26 VITALS — BP 122/81 | HR 113 | Temp 100.3°F | Resp 16

## 2022-09-26 DIAGNOSIS — T3695XA Adverse effect of unspecified systemic antibiotic, initial encounter: Secondary | ICD-10-CM

## 2022-09-26 DIAGNOSIS — B379 Candidiasis, unspecified: Secondary | ICD-10-CM

## 2022-09-26 DIAGNOSIS — J020 Streptococcal pharyngitis: Secondary | ICD-10-CM

## 2022-09-26 LAB — POCT RAPID STREP A (OFFICE): Rapid Strep A Screen: POSITIVE — AB

## 2022-09-26 MED ORDER — FLUCONAZOLE 150 MG PO TABS: 150.0000 mg | ORAL_TABLET | Freq: Once | ORAL | 0 refills | Status: AC

## 2022-09-26 MED ORDER — AMOXICILLIN 500 MG PO CAPS: 500.0000 mg | ORAL_CAPSULE | Freq: Two times a day (BID) | ORAL | 0 refills | Status: DC

## 2022-09-26 MED ORDER — IBUPROFEN 600 MG PO TABS
600.0000 mg | ORAL_TABLET | Freq: Once | ORAL | Status: AC
  Administered 2022-09-26: 600 mg via ORAL

## 2022-09-26 NOTE — ED Triage Notes (Signed)
Sore throat with white patches that started Monday. Fever, body aches, chills that started yesterday. Taking ibuprofen. Pt would like to have diflucan if prescribed antibiotic.

## 2022-09-26 NOTE — ED Provider Notes (Signed)
Roderic Palau    CSN: [id number] Arrival date & time: 09/26/22  1803      History   Chief Complaint Chief Complaint  Patient presents with   Sore Throat    White patches - Entered by patient    HPI Kimberly Hanna is a 37 y.o. female.   Patient presents today with a 2 to 3-day history of sore throat.  She reports that over the past 24 hours her symptoms have significantly worsened and her pain has become 9 on a 0-10 pain scale, described as aching, worse with attempted swallowing, no alleviating factors identified.  She denies any known sick contacts; does report that her children have had URI symptoms but denies any exposure to strep pharyngitis.  She has had some mild cough and congestion but denies additional symptoms including chest pain, shortness of breath, nausea, vomiting.  She has been taking ibuprofen with last dose at noon today.  This is provided minimal relief of symptoms.  She reports last antibiotics was amoxicillin approximately 2 months ago for similar symptoms.  Denies additional antibiotics in the past 90 days.  She is requesting a prescription for Diflucan if she receives antibiotics due to recurrent yeast infections.  She is able to eat and drink despite symptoms.    Past Medical History:  Diagnosis Date   Anemia    Anxiety    Complication of anesthesia    shaking/ headache with spinal   Indigestion     There are no problems to display for this patient.   Past Surgical History:  Procedure Laterality Date   CESAREAN SECTION     CESAREAN SECTION N/A 04/04/2015   Procedure: CESAREAN SECTION;  Surgeon: Malachy Mood, MD;  Location: [facility] ORS;  Service: Obstetrics;  Laterality: N/A;   CESAREAN SECTION N/A 01/30/2017   Procedure: CESAREAN SECTION;  Surgeon: Malachy Mood, MD;  Location: [facility] ORS;  Service: Obstetrics;  Laterality: N/A;   TUBAL LIGATION  01/30/2017    OB History     Gravida  3   Para  3   Term  3   Preterm      AB       Living  3      SAB      IAB      Ectopic      Multiple  0   Live Births  3            Home Medications    Prior to Admission medications   Medication Sig Start Date End Date Taking? Authorizing Provider  amoxicillin (AMOXIL) 500 MG capsule Take 1 capsule (500 mg total) by mouth 2 (two) times daily. 09/26/22  Yes Emmalyne Giacomo K, PA-C  escitalopram (LEXAPRO) 10 MG tablet Take 1 tablet by mouth daily. 08/26/22  Yes [provider]  fluconazole (DIFLUCAN) 150 MG tablet Take 1 tablet (150 mg total) by mouth once for 1 dose. 09/26/22 09/26/22 Yes Zia Najera, Derry Skill, PA-C    Family History History reviewed. No pertinent family history.  Social History Social History   Tobacco Use   Smoking status: Never   Smokeless tobacco: Never  Vaping Use   Vaping Use: Never used  Substance Use Topics   Alcohol use: No   Drug use: No     Allergies   Patient has no known allergies.   Review of Systems Review of Systems  Constitutional:  Positive for activity change and fatigue. Negative for appetite change and fever.  HENT:  Positive for sore throat and trouble swallowing. Negative for congestion, sinus pressure, sneezing and voice change.   Respiratory:  Positive for cough. Negative for shortness of breath.   Cardiovascular:  Negative for chest pain.  Gastrointestinal:  Negative for abdominal pain, diarrhea, nausea and vomiting.  Musculoskeletal:  Positive for arthralgias and myalgias.  Neurological:  Positive for headaches. Negative for dizziness and light-headedness.     Physical Exam Triage Vital Signs ED Triage Vitals  Enc Vitals Group     BP 09/26/22 1833 122/81     Pulse Rate 09/26/22 1833 (!) 113     Resp 09/26/22 1833 16     Temp 09/26/22 1833 100.3 F (37.9 C)     Temp Source 09/26/22 1833 Oral     SpO2 09/26/22 1833 100 %     Weight --      Height --      Head Circumference --      Peak Flow --      Pain Score 09/26/22 1834 9     Pain Loc --       Pain Edu? --      Excl. in [place]? --    No data found.  Updated Vital Signs BP 122/81 (BP Location: Right Arm)   Pulse (!) 113   Temp 100.3 F (37.9 C) (Oral)   Resp 16   LMP 08/30/2022 (Approximate)   SpO2 100%   Visual Acuity Right Eye Distance:   Left Eye Distance:   Bilateral Distance:    Right Eye Near:   Left Eye Near:    Bilateral Near:     Physical Exam Vitals reviewed.  Constitutional:      General: She is awake. She is not in acute distress.    Appearance: Normal appearance. She is well-developed. She is not ill-appearing.     Comments: Very pleasant female appears stated age in no acute distress sitting comfortably in exam room  HENT:     Head: Normocephalic and atraumatic.     Right Ear: Tympanic membrane, ear canal and external ear normal. Tympanic membrane is not erythematous or bulging.     Left Ear: Tympanic membrane, ear canal and external ear normal. Tympanic membrane is not erythematous or bulging.     Nose:     Right Sinus: No maxillary sinus tenderness or frontal sinus tenderness.     Left Sinus: No maxillary sinus tenderness or frontal sinus tenderness.     Mouth/Throat:     Pharynx: Uvula midline. Posterior oropharyngeal erythema present. No oropharyngeal exudate.     Tonsils: Tonsillar exudate present. No tonsillar abscesses.  Cardiovascular:     Rate and Rhythm: Regular rhythm. Tachycardia present.     Heart sounds: Normal heart sounds, S1 normal and S2 normal. No murmur heard. Pulmonary:     Effort: Pulmonary effort is normal.     Breath sounds: Normal breath sounds. No wheezing, rhonchi or rales.     Comments: Clear to auscultation bilaterally Psychiatric:        Behavior: Behavior is cooperative.      UC Treatments / Results  Labs (all labs ordered are listed, but only abnormal results are displayed) Labs Reviewed  POCT RAPID STREP A (OFFICE) - Abnormal; Notable for the following components:      Result Value   Rapid Strep A Screen  Positive (*)    All other components within normal limits    EKG   Radiology No results found.  Procedures Procedures (including critical  care time)  Medications Ordered in UC Medications  ibuprofen (ADVIL) tablet 600 mg (600 mg Oral Given 09/26/22 1857)    Initial Impression / Assessment and Plan / UC Course  I have reviewed the triage vital signs and the nursing notes.  Pertinent labs & imaging results that were available during my care of the patient were reviewed by me and considered in my medical decision making (see chart for details).     Patient is tachycardic with mild fever but otherwise is well-appearing and nontoxic.  Strep testing was positive.  She was started on amoxicillin 500 mg twice daily for 10 days.  She reports regularly getting yeast infections with antibiotic use and so was provided 1 dose of Diflucan as needed.  Recommend that she gargle with warm salt water and alternate Tylenol and ibuprofen.  She was given a dose of ibuprofen in clinic.  She can gargle with warm salt water for additional symptom relief.  Discussed that she is contagious for 24 hours after symptoms began and she was provided a work excuse note.  If she has any worsening symptoms including increasing pain, difficulty swallowing, shortness of breath, muffled voice that she needs to be seen immediately.  Strict return precautions given.  Final Clinical Impressions(s) / UC Diagnoses   Final diagnoses:  Strep pharyngitis  Antibiotic-induced yeast infection     Discharge Instructions      You tested positive for strep pharyngitis.  Start amoxicillin twice daily as prescribed.  I have called in 1 dose of Diflucan in case you develop any yeast infection symptoms.  Use over-the-counter medications including Tylenol and ibuprofen for pain.  Gargle with warm salt water.  Throw your toothbrush a few days after starting medication to prevent reinfection.  Follow-up with primary care if symptoms do  not improve significantly in the next couple of days.  You are contagious for 24 hours after starting medication so I have provided an excuse note.  If you have any worsening symptoms including high fever, difficulty swallowing, swelling of your throat, shortness of breath, muffled voice you need to be seen immediately.      ED Prescriptions     Medication Sig Dispense Auth. Provider   amoxicillin (AMOXIL) 500 MG capsule Take 1 capsule (500 mg total) by mouth 2 (two) times daily. 20 capsule Falyn Rubel K, PA-C   fluconazole (DIFLUCAN) 150 MG tablet Take 1 tablet (150 mg total) by mouth once for 1 dose. 1 tablet Coralyn Roselli, Derry Skill, PA-C      PDMP not reviewed this encounter.   Terrilee Croak, PA-C 09/26/22 [id number]

## 2022-09-26 NOTE — Discharge Instructions (Signed)
You tested positive for strep pharyngitis.  Start amoxicillin twice daily as prescribed.  I have called in 1 dose of Diflucan in case you develop any yeast infection symptoms.  Use over-the-counter medications including Tylenol and ibuprofen for pain.  Gargle with warm salt water.  Throw your toothbrush a few days after starting medication to prevent reinfection.  Follow-up with primary care if symptoms do not improve significantly in the next couple of days.  You are contagious for 24 hours after starting medication so I have provided an excuse note.  If you have any worsening symptoms including high fever, difficulty swallowing, swelling of your throat, shortness of breath, muffled voice you need to be seen immediately.

## 2023-06-17 ENCOUNTER — Emergency Department: Payer: Managed Care, Other (non HMO)

## 2023-06-17 ENCOUNTER — Other Ambulatory Visit: Payer: Self-pay

## 2023-06-17 ENCOUNTER — Emergency Department
Admission: EM | Admit: 2023-06-17 | Discharge: 2023-06-17 | Disposition: A | Discharge status: Home / Self Care | Payer: Managed Care, Other (non HMO) | Attending: Emergency Medicine | Admitting: Emergency Medicine

## 2023-06-17 DIAGNOSIS — S61411A Laceration without foreign body of right hand, initial encounter: Secondary | ICD-10-CM | POA: Diagnosis not present

## 2023-06-17 DIAGNOSIS — Z23 Encounter for immunization: Secondary | ICD-10-CM | POA: Insufficient documentation

## 2023-06-17 DIAGNOSIS — W540XXA Bitten by dog, initial encounter: Secondary | ICD-10-CM | POA: Diagnosis not present

## 2023-06-17 DIAGNOSIS — S6991XA Unspecified injury of right wrist, hand and finger(s), initial encounter: Secondary | ICD-10-CM | POA: Diagnosis present

## 2023-06-17 LAB — CBC WITH DIFFERENTIAL/PLATELET
Abs Immature Granulocytes: 0.02 10*3/uL (ref 0.00–0.07)
Basophils Absolute: 0 10*3/uL (ref 0.0–0.1)
Basophils Relative: 1 %
Eosinophils Absolute: 0.1 10*3/uL (ref 0.0–0.5)
Eosinophils Relative: 2 %
HCT: 34.3 % — ABNORMAL LOW (ref 36.0–46.0)
Hemoglobin: 10.6 g/dL — ABNORMAL LOW (ref 12.0–15.0)
Immature Granulocytes: 0 %
Lymphocytes Relative: 19 %
Lymphs Abs: 1.5 10*3/uL (ref 0.7–4.0)
MCH: 26.1 pg (ref 26.0–34.0)
MCHC: 30.9 g/dL (ref 30.0–36.0)
MCV: 84.5 fL (ref 80.0–100.0)
Monocytes Absolute: 0.9 10*3/uL (ref 0.1–1.0)
Monocytes Relative: 12 %
Neutro Abs: 5.3 10*3/uL (ref 1.7–7.7)
Neutrophils Relative %: 66 %
Platelets: 238 10*3/uL (ref 150–400)
RBC: 4.06 MIL/uL (ref 3.87–5.11)
RDW: 15 % (ref 11.5–15.5)
WBC: 7.8 10*3/uL (ref 4.0–10.5)
nRBC: 0 % (ref 0.0–0.2)

## 2023-06-17 LAB — BASIC METABOLIC PANEL
Anion gap: 7 (ref 5–15)
BUN: 17 mg/dL (ref 6–20)
CO2: 24 mmol/L (ref 22–32)
Calcium: 8.8 mg/dL — ABNORMAL LOW (ref 8.9–10.3)
Chloride: 102 mmol/L (ref 98–111)
Creatinine, Ser: 0.66 mg/dL (ref 0.44–1.00)
GFR, Estimated: >60 mL/min (ref >60)
Glucose, Bld: 93 mg/dL (ref 70–99)
Potassium: 3.5 mmol/L (ref 3.5–5.1)
Sodium: 133 mmol/L — ABNORMAL LOW (ref 135–145)

## 2023-06-17 MED ORDER — TETANUS-DIPHTH-ACELL PERTUSSIS 5-2.5-18.5 LF-MCG/0.5 IM SUSY
0.5000 mL | PREFILLED_SYRINGE | Freq: Once | INTRAMUSCULAR | Status: AC
  Administered 2023-06-17: 0.5 mL via INTRAMUSCULAR
  Filled 2023-06-17: qty 0.5

## 2023-06-17 MED ORDER — CLINDAMYCIN PHOSPHATE 600 MG/50ML IV SOLN
600.0000 mg | Freq: Once | INTRAVENOUS | Status: AC
  Administered 2023-06-17: 600 mg via INTRAVENOUS
  Filled 2023-06-17: qty 50

## 2023-06-17 MED ORDER — HYDROCODONE-ACETAMINOPHEN 5-325 MG PO TABS: 1.0000 | ORAL_TABLET | Freq: Four times a day (QID) | ORAL | 0 refills | Status: DC | PRN

## 2023-06-17 MED ORDER — AMOXICILLIN-POT CLAVULANATE 875-125 MG PO TABS: 1.0000 | ORAL_TABLET | Freq: Two times a day (BID) | ORAL | 0 refills | Status: DC

## 2023-06-17 NOTE — ED Provider Notes (Signed)
[facility] Provider Note    Event Date/Time   First MD Initiated Contact with Patient 06/17/23 1206     (approximate)   History   Animal Bite   HPI  Kimberly Hanna is a 37 y.o. female   presents to the ED with complaint of dog bite that occurred last evening.  Patient states that she thought the dog was created when she entered her home.  She was bit on her right hand.  Today is swollen and painful.  She states the dog is up-to-date on rabies vaccine.  Patient's last Tdap was 2018.      Physical Exam   Triage Vital Signs: ED Triage Vitals  Encounter Vitals Group     BP 06/17/23 1203 131/76     Systolic BP Percentile --      Diastolic BP Percentile --      Pulse Rate 06/17/23 1203 (!) 102     Resp 06/17/23 1203 18     Temp 06/17/23 1203 98.3 F (36.8 C)     Temp src --      SpO2 06/17/23 1203 98 %     Weight 06/17/23 1200 150 lb (68 kg)     Height 06/17/23 1200 5\' 5"  (1.651 m)     Head Circumference --      Peak Flow --      Pain Score 06/17/23 1200 7     Pain Loc --      Pain Education --      Exclude from Growth Chart --     Most recent vital signs: Vitals:   06/17/23 1203  BP: 131/76  Pulse: (!) 102  Resp: 18  Temp: 98.3 F (36.8 C)  SpO2: 98%     General: Awake, no distress.  CV:  Good peripheral perfusion.  Resp:  Normal effort.  Abd:  No distention.  Other:  Right hand dorsal aspect with moderate soft tissue edema especially over the third, fourth and fifth metacarpal area.  There is superficial irregular laceration also in this area without active bleeding and no obvious foreign body.  Patient is able to move digits without difficulty.  There is also a superficial laceration to the distal tuft of the finger without active bleeding.  Motor or sensory function intact.  Radial pulse present.   ED Results / Procedures / Treatments   Labs (all labs ordered are listed, but only abnormal results are displayed) Labs  Reviewed  CBC WITH DIFFERENTIAL/PLATELET - Abnormal; Notable for the following components:      Result Value   Hemoglobin 10.6 (*)    HCT 34.3 (*)    All other components within normal limits  BASIC METABOLIC PANEL - Abnormal; Notable for the following components:   Sodium 133 (*)    Calcium 8.8 (*)    All other components within normal limits     RADIOLOGY Right hand x-ray images were reviewed by myself independent of the radiologist and no fracture or opaque foreign bodies were noted.    PROCEDURES:  Critical Care performed:   Procedures   MEDICATIONS ORDERED IN ED: Medications  clindamycin (CLEOCIN) IVPB 600 mg (0 mg Intravenous Stopped 06/17/23 1309)  Tdap (BOOSTRIX) injection 0.5 mL (0.5 mLs Intramuscular Given 06/17/23 1334)     IMPRESSION / MDM / ASSESSMENT AND PLAN / ED COURSE  I reviewed the triage vital signs and the nursing notes.   Differential diagnosis includes, but is not limited to, dog bite right  hand, fracture, foreign body, Tdap immunization, rabies discussed but not warranted.  37 year old female presents to the ED with dog bite to her right hand that occurred last evening.  Patient is up-to-date on immunizations and Tdap was given to update the patient while in the ED.  X-ray was reassuring.  Patient was given clindamycin IV while in the emergency department.  A prescription for Augmentin 875 twice daily for 10 days was sent to the pharmacy along with prescription for hydrocodone if needed for pain.  Patient is aware that she is to watch this area for any signs of infection and should she develop any fever, chills, purulent drainage she is to return to the emergency department over the holiday.  Elevation to help reduce the swelling.      Patient's presentation is most consistent with acute complicated illness / injury requiring diagnostic workup.  FINAL CLINICAL IMPRESSION(S) / ED DIAGNOSES   Final diagnoses:  Dog bite of right hand, initial  encounter     Rx / DC Orders   ED Discharge Orders          Ordered    amoxicillin-clavulanate (AUGMENTIN) 875-125 MG tablet  2 times daily        06/17/23 1320    HYDROcodone-acetaminophen (NORCO/VICODIN) 5-325 MG tablet  Every 6 hours PRN        06/17/23 1320             Note:  This document was prepared using Dragon voice recognition software and may include unintentional dictation errors.   Tommi Rumps, PA-C 06/17/23 1447    Minna Antis, MD 06/17/23 1453

## 2023-06-17 NOTE — Discharge Instructions (Addendum)
Follow-up with your primary care provider if any continued problems or concerns.  Keep the area clean and dry and watch for any signs of infection.  Return to the emergency department over the holiday if there is any worsening of your wound, increased swelling, fever, chills, pus coming from the wound site.  Elevate often to reduce swelling and help with pain. Begin taking the antibiotic that was sent to the pharmacy and also pain medication was sent to the pharmacy for you to take as needed.  Be aware that the pain medication could cause drowsiness and increase your risk for injury.

## 2023-06-17 NOTE — ED Triage Notes (Signed)
Pt comes with c/o dog bite. Pt states her dog bit her last night. Pt states right hand pain. Pt hand is swollen, red and pt states she has several different spots where  he got her. Pt states dog is up to date on vaccines.

## 2023-09-16 NOTE — H&P (Signed)
 Kimberly Hanna is a 38 y.o. female here for Pre Op Consulting (Sign consents) .   History of Present Illness: Patient presents for a preoperative visit to schedule a D&C, hysteroscopy, and Novasure ablation.   She has a hx of: AUB   Workup has included: SIS, EMBx and Pap smear   The patient underwent a preliminary transvaginal pelvic sonogram. - Uterus: retroverted 8.13 x 4.22 x 5.59 cm  - Endometrium: 6.63 mm  - Ovaries: bilaterally wnl    MENSTRUAL ENDOMETRIUM. NO  EVIDENCE OF HYPERPLASIA, ENDOMETRITIS OR ATYPIA.    Pap smear 1/25: insufficient cells   Pertinent Surgical Hx: C/s x3   Past Medical History:  has a past medical history of Anemia, Anxiety (06/24/2021), and Thyroid nodule.  Past Surgical History:  has a past surgical history that includes Cesarean section and Tubal ligation (2018). Family History: family history includes Anxiety in her mother; Coronary Artery Disease (Blocked arteries around heart) in her father and paternal uncle; Diabetes in her father; Esophageal cancer in her paternal uncle; Heart disease in her paternal grandmother and paternal uncle; High blood pressure (Hypertension) in her father; Hyperlipidemia (Elevated cholesterol) in her father; Leukemia in her paternal uncle. Social History:  reports that she quit smoking about 13 years ago. Her smoking use included cigarettes. She has a 2 pack-year smoking history. She has been exposed to tobacco smoke. She has never used smokeless tobacco. She reports that she does not currently use alcohol. She reports that she does not use drugs. OB/GYN History:  OB History       Gravida 3   Para 3   Term 3   Preterm     AB     Living 3       SAB     IAB     Ectopic     Molar     Multiple     Live Births            Allergies: has No Known Allergies. Medications:  Current Medications   Current Outpatient Medications:    escitalopram oxalate (LEXAPRO) 10 MG tablet, Take 1 tablet  (10 mg total) by mouth once daily, Disp: 30 tablet, Rfl: 5    Review of Systems: No SOB, no palpitations or chest pain, no new lower extremity edema, no nausea or vomiting or bowel or bladder complaints. See HPI for gyn specific ROS.    Exam:   BP 115/76   Pulse 92   Ht 165.1 cm (5\' 5" )   Wt 72.2 kg (159 lb 3.2 oz)   LMP 09/04/2023 (Exact Date)   BMI 26.49 kg/m    General: Patient is well-groomed, well-nourished, appears stated age in no acute distress   HEENT: head is atraumatic and normocephalic, trachea is midline, neck is supple with no palpable nodules   CV: Regular rhythm and normal heart rate, no murmur   Pulm: Clear to auscultation throughout lung fields with no wheezing, crackles, or rhonchi. No increased work of breathing   Abdomen: soft , no mass, non-tender, no rebound tenderness, no hepatomegaly   Pelvic: deferred   Impression:   The encounter diagnosis was Abnormal uterine bleeding (AUB).   Plan:   1.  Preoperative visit: D&C hysteroscopy, Noavsure. Consents signed today.  -Risks of surgery were discussed with the patient including but not limited to: bleeding which may require transfusion; infection which may require antibiotics; injury to uterus or surrounding organs; intrauterine scarring which may impair future fertility; need for additional procedures including  laparotomy or laparoscopy; and other postoperative/anesthesia complications. Written informed consent was obtained.  She is also aware that the ablation may not last until menopause and keep [place] from assessing her uterine cavity in the future in case of AUB or malignancy.   We have previously discussed options, including ocps, IUD, novasure, hysterectomy. She prefers novasure, knowing the risk with 3 prior c/s and with her young age that the bleeding may return. She tolerated the SIS procedure well with some sympathetic response.   This is a scheduled same-day surgery. She will have a postop visit in  2 weeks to review operative findings and pathology.

## 2023-09-17 ENCOUNTER — Encounter
Admission: RE | Admit: 2023-09-17 | Discharge: 2023-09-17 | Disposition: A | Discharge status: Home / Self Care | Source: Ambulatory Visit | Attending: Obstetrics and Gynecology | Admitting: Obstetrics and Gynecology

## 2023-09-17 ENCOUNTER — Other Ambulatory Visit: Payer: Self-pay

## 2023-09-17 VITALS — Ht 65.0 in | Wt 159.0 lb

## 2023-09-17 DIAGNOSIS — Z01812 Encounter for preprocedural laboratory examination: Secondary | ICD-10-CM

## 2023-09-17 HISTORY — DX: Nontoxic single thyroid nodule: E04.1

## 2023-09-17 HISTORY — DX: Other specified postprocedural states: Z98.890

## 2023-09-17 NOTE — Patient Instructions (Addendum)
 Your procedure is scheduled on: Friday March 28  Report to the Registration Desk on the 1st floor of the [facility] To find out your arrival time, please call [phone] between 1PM - 3PM on:  Thursday March 27  If your arrival time is 6:00 am, do not arrive before that time as the [facility] entrance doors do not open until 6:00 am.  REMEMBER: Instructions that are not followed completely may result in serious medical risk, up to and including death; or upon the discretion of your surgeon and anesthesiologist your surgery may need to be rescheduled.  Do not eat food after midnight the night before surgery.  No gum chewing or hard candies.  You may however, drink CLEAR liquids up to 2 hours before you are scheduled to arrive for your surgery. Do not drink anything within 2 hours of your scheduled arrival time.  Clear liquids include: - water  - apple juice without pulp - gatorade (not RED colors) - black coffee or tea (Do NOT add milk or creamers to the coffee or tea) Do NOT drink anything that is not on this list.  One week prior to surgery: Start immediately  Stop Anti-inflammatories (NSAIDS) such as Advil, Aleve, Ibuprofen, Motrin, Naproxen, Naprosyn and Aspirin based products such as Excedrin, Goody's Powder, BC Powder. Stop ANY OVER THE COUNTER supplements until after surgery.  You may however, continue to take Tylenol if needed for pain up until the day of surgery.  Continue taking all of your other prescription medications up until the day of surgery.  ON THE DAY OF SURGERY ONLY TAKE THESE MEDICATIONS WITH SIPS OF WATER:  escitalopram (LEXAPRO)   No Alcohol for 24 hours before or after surgery.  No Smoking including e-cigarettes for 24 hours before surgery.  No chewable tobacco products for at least 6 hours before surgery.  No nicotine patches on the day of surgery.  Do not use any "recreational" drugs for at least a week (preferably 2 weeks) before your surgery.   Please be advised that the combination of cocaine and anesthesia may have negative outcomes, up to and including death. If you test positive for cocaine, your surgery will be cancelled.  On the morning of surgery brush your teeth with toothpaste and water, you may rinse your mouth with mouthwash if you wish. Do not swallow any toothpaste or mouthwash.  Do not wear jewelry, make-up, hairpins, clips or nail polish.  For welded (permanent) jewelry: bracelets, anklets, waist bands, etc.  Please have this removed prior to surgery.  If it is not removed, there is a chance that hospital personnel will need to cut it off on the day of surgery.  Do not wear lotions, powders, or perfumes.   Do not shave body hair from the neck down 48 hours before surgery.  Contact lenses, hearing aids and dentures may not be worn into surgery.  Do not bring valuables to the hospital. [facility] is not responsible for any missing/lost belongings or valuables.   Notify your doctor if there is any change in your medical condition (cold, fever, infection).  Wear comfortable clothing (specific to your surgery type) to the hospital.  After surgery, you can help prevent lung complications by doing breathing exercises.  Take deep breaths and cough every 1-2 hours. Your doctor may order a device called an Incentive Spirometer to help you take deep breaths.  If you are being discharged the day of surgery, you will not be allowed to drive home.  You will need a responsible individual to drive you home and stay with you for 24 hours after surgery.   If you are taking public transportation, you will need to have a responsible individual with you.  Please call the Pre-admissions Testing Dept. at [phone] if you have any questions about these instructions.  Surgery Visitation Policy:  Patients having surgery or a procedure may have two visitors.  Children under the age of 4 must have an adult with them who is not  the patient.  Temporary Visitor Restrictions Due to increasing cases of flu, RSV and COVID-19: Children ages 57 and under will not be able to visit patients in [facility] hospitals under most circumstances.     How to Use an Incentive Spirometer  An incentive spirometer is a tool that measures how well you are filling your lungs with each breath. Learning to take long, deep breaths using this tool can help you keep your lungs clear and active. This may help to reverse or lessen your chance of developing breathing (pulmonary) problems, especially infection. You may be asked to use a spirometer: After a surgery. If you have a lung problem or a history of smoking. After a long period of time when you have been unable to move or be active. If the spirometer includes an indicator to show the highest number that you have reached, your health care provider or respiratory therapist will help you set a goal. Keep a log of your progress as told by your health care provider. What are the risks? Breathing too quickly may cause dizziness or cause you to pass out. Take your time so you do not get dizzy or light-headed. If you are in pain, you may need to take pain medicine before doing incentive spirometry. It is harder to take a deep breath if you are having pain. How to use your incentive spirometer  Sit up on the edge of your bed or on a chair. Hold the incentive spirometer so that it is in an upright position. Before you use the spirometer, breathe out normally. Place the mouthpiece in your mouth. Make sure your lips are closed tightly around it. Breathe in slowly and as deeply as you can through your mouth, causing the piston or the ball to rise toward the top of the chamber. Hold your breath for 3-5 seconds, or for as long as possible. If the spirometer includes a coach indicator, use this to guide you in breathing. Slow down your breathing if the indicator goes above the marked areas. Remove  the mouthpiece from your mouth and breathe out normally. The piston or ball will return to the bottom of the chamber. Rest for a few seconds, then repeat the steps 10 or more times. Take your time and take a few normal breaths between deep breaths so that you do not get dizzy or light-headed. Do this every 1-2 hours when you are awake. If the spirometer includes a goal marker to show the highest number you have reached (best effort), use this as a goal to work toward during each repetition. After each set of 10 deep breaths, cough a few times. This will help to make sure that your lungs are clear. If you have an incision on your chest or abdomen from surgery, place a pillow or a rolled-up towel firmly against the incision when you cough. This can help to reduce pain while taking deep breaths and coughing. General tips When you are able to get out  of bed: Walk around often. Continue to take deep breaths and cough in order to clear your lungs. Keep using the incentive spirometer until your health care provider says it is okay to stop using it. If you have been in the hospital, you may be told to keep using the spirometer at home. Contact a health care provider if: You are having difficulty using the spirometer. You have trouble using the spirometer as often as instructed. Your pain medicine is not giving enough relief for you to use the spirometer as told. You have a fever. Get help right away if: You develop shortness of breath. You develop a cough with bloody mucus from the lungs. You have fluid or blood coming from an incision site after you cough. Summary An incentive spirometer is a tool that can help you learn to take long, deep breaths to keep your lungs clear and active. You may be asked to use a spirometer after a surgery, if you have a lung problem or a history of smoking, or if you have been inactive for a long period of time. Use your incentive spirometer as instructed every 1-2  hours while you are awake. If you have an incision on your chest or abdomen, place a pillow or a rolled-up towel firmly against your incision when you cough. This will help to reduce pain. Get help right away if you have shortness of breath, you cough up bloody mucus, or blood comes from your incision when you cough. This information is not intended to replace advice given to you by your health care provider. Make sure you discuss any questions you have with your health care provider. Document Revised: 08/30/2019 Document Reviewed: 08/30/2019 Elsevier Patient Education  2023 [employer].

## 2023-09-19 ENCOUNTER — Encounter: Payer: Self-pay | Admitting: Obstetrics and Gynecology

## 2023-09-19 ENCOUNTER — Other Ambulatory Visit: Payer: Self-pay

## 2023-09-19 ENCOUNTER — Ambulatory Visit: Admitting: Certified Registered"

## 2023-09-19 ENCOUNTER — Encounter
Admission: RE | Disposition: A | Discharge status: Home / Self Care | Payer: Self-pay | Source: Ambulatory Visit | Attending: Obstetrics and Gynecology

## 2023-09-19 ENCOUNTER — Ambulatory Visit
Admission: RE | Admit: 2023-09-19 | Discharge: 2023-09-19 | Disposition: A | Discharge status: Home / Self Care | Source: Ambulatory Visit | Attending: Obstetrics and Gynecology | Admitting: Obstetrics and Gynecology

## 2023-09-19 DIAGNOSIS — Z01812 Encounter for preprocedural laboratory examination: Secondary | ICD-10-CM

## 2023-09-19 DIAGNOSIS — N939 Abnormal uterine and vaginal bleeding, unspecified: Secondary | ICD-10-CM | POA: Diagnosis not present

## 2023-09-19 DIAGNOSIS — N841 Polyp of cervix uteri: Secondary | ICD-10-CM | POA: Diagnosis not present

## 2023-09-19 DIAGNOSIS — F419 Anxiety disorder, unspecified: Secondary | ICD-10-CM | POA: Diagnosis not present

## 2023-09-19 DIAGNOSIS — Z98891 History of uterine scar from previous surgery: Secondary | ICD-10-CM | POA: Insufficient documentation

## 2023-09-19 DIAGNOSIS — N920 Excessive and frequent menstruation with regular cycle: Secondary | ICD-10-CM | POA: Insufficient documentation

## 2023-09-19 HISTORY — PX: HYSTEROSCOPY: SHX211

## 2023-09-19 LAB — CBC
HCT: 37.2 % (ref 36.0–46.0)
Hemoglobin: 12 g/dL (ref 12.0–15.0)
MCH: 27 pg (ref 26.0–34.0)
MCHC: 32.3 g/dL (ref 30.0–36.0)
MCV: 83.6 fL (ref 80.0–100.0)
Platelets: 232 10*3/uL (ref 150–400)
RBC: 4.45 MIL/uL (ref 3.87–5.11)
RDW: 17.2 % — ABNORMAL HIGH (ref 11.5–15.5)
WBC: 5.3 10*3/uL (ref 4.0–10.5)
nRBC: 0 % (ref 0.0–0.2)

## 2023-09-19 LAB — POCT PREGNANCY, URINE: Preg Test, Ur: NEGATIVE

## 2023-09-19 SURGERY — ABLATION, ENDOMETRIUM, HYSTEROSCOPIC
Anesthesia: General | Site: Uterus

## 2023-09-19 MED ORDER — ACETAMINOPHEN 500 MG PO TABS
ORAL_TABLET | ORAL | Status: AC
  Filled 2023-09-19: qty 2

## 2023-09-19 MED ORDER — 0.9 % SODIUM CHLORIDE (POUR BTL) OPTIME
TOPICAL | Status: DC | PRN
  Administered 2023-09-19: 500 mL

## 2023-09-19 MED ORDER — PHENYLEPHRINE 80 MCG/ML (10ML) SYRINGE FOR IV PUSH (FOR BLOOD PRESSURE SUPPORT)
PREFILLED_SYRINGE | INTRAVENOUS | Status: DC | PRN
  Administered 2023-09-19: 80 ug via INTRAVENOUS

## 2023-09-19 MED ORDER — CEFAZOLIN SODIUM-DEXTROSE 2-4 GM/100ML-% IV SOLN
INTRAVENOUS | Status: AC
  Filled 2023-09-19: qty 100

## 2023-09-19 MED ORDER — ACETAMINOPHEN 500 MG PO TABS
1000.0000 mg | ORAL_TABLET | ORAL | Status: AC
  Administered 2023-09-19: 1000 mg via ORAL

## 2023-09-19 MED ORDER — PROPOFOL 500 MG/50ML IV EMUL
INTRAVENOUS | Status: DC | PRN
  Administered 2023-09-19: 150 ug/kg/min via INTRAVENOUS

## 2023-09-19 MED ORDER — CHLORHEXIDINE GLUCONATE 0.12 % MT SOLN
15.0000 mL | Freq: Once | OROMUCOSAL | Status: AC
  Administered 2023-09-19: 15 mL via OROMUCOSAL

## 2023-09-19 MED ORDER — PROPOFOL 1000 MG/100ML IV EMUL
INTRAVENOUS | Status: AC
Start: 2023-09-19 — End: ?
  Filled 2023-09-19: qty 100

## 2023-09-19 MED ORDER — MIDAZOLAM HCL 2 MG/2ML IJ SOLN
INTRAMUSCULAR | Status: AC
  Filled 2023-09-19: qty 2

## 2023-09-19 MED ORDER — LIDOCAINE-EPINEPHRINE 1 %-1:100000 IJ SOLN
INTRAMUSCULAR | Status: DC | PRN
  Administered 2023-09-19: 20 mL

## 2023-09-19 MED ORDER — POVIDONE-IODINE 10 % EX SWAB: 2.0000 | Freq: Once | CUTANEOUS | Status: DC

## 2023-09-19 MED ORDER — ONDANSETRON HCL 4 MG/2ML IJ SOLN
INTRAMUSCULAR | Status: DC | PRN
  Administered 2023-09-19: 4 mg via INTRAVENOUS

## 2023-09-19 MED ORDER — MIDAZOLAM HCL 2 MG/2ML IJ SOLN
INTRAMUSCULAR | Status: DC | PRN
  Administered 2023-09-19: 2 mg via INTRAVENOUS

## 2023-09-19 MED ORDER — CELECOXIB 200 MG PO CAPS: 200.0000 mg | ORAL_CAPSULE | Freq: Two times a day (BID) | ORAL | 0 refills | Status: AC

## 2023-09-19 MED ORDER — CHLORHEXIDINE GLUCONATE 0.12 % MT SOLN
OROMUCOSAL | Status: AC
  Filled 2023-09-19: qty 15

## 2023-09-19 MED ORDER — FENTANYL CITRATE (PF) 100 MCG/2ML IJ SOLN
INTRAMUSCULAR | Status: DC | PRN
  Administered 2023-09-19: 50 ug via INTRAVENOUS
  Administered 2023-09-19 (×2): 25 ug via INTRAVENOUS

## 2023-09-19 MED ORDER — OXYCODONE HCL 5 MG PO TABS: 5.0000 mg | ORAL_TABLET | ORAL | 0 refills | Status: AC | PRN

## 2023-09-19 MED ORDER — LACTATED RINGERS IV SOLN: INTRAVENOUS | Status: DC

## 2023-09-19 MED ORDER — DOCUSATE SODIUM 100 MG PO CAPS: 100.0000 mg | ORAL_CAPSULE | Freq: Two times a day (BID) | ORAL | 0 refills | Status: AC

## 2023-09-19 MED ORDER — FENTANYL CITRATE (PF) 100 MCG/2ML IJ SOLN
INTRAMUSCULAR | Status: AC
  Filled 2023-09-19: qty 2

## 2023-09-19 MED ORDER — LIDOCAINE-EPINEPHRINE 1 %-1:100000 IJ SOLN
INTRAMUSCULAR | Status: AC
  Filled 2023-09-19: qty 1

## 2023-09-19 MED ORDER — DEXAMETHASONE SODIUM PHOSPHATE 10 MG/ML IJ SOLN
INTRAMUSCULAR | Status: DC | PRN
  Administered 2023-09-19: 5 mg via INTRAVENOUS

## 2023-09-19 MED ORDER — DEXMEDETOMIDINE HCL IN NACL 80 MCG/20ML IV SOLN
INTRAVENOUS | Status: DC | PRN
  Administered 2023-09-19: 8 ug via INTRAVENOUS
  Administered 2023-09-19: 12 ug via INTRAVENOUS
  Administered 2023-09-19: 8 ug via INTRAVENOUS

## 2023-09-19 MED ORDER — ORAL CARE MOUTH RINSE: 15.0000 mL | Freq: Once | OROMUCOSAL | Status: AC

## 2023-09-19 MED ORDER — ACETAMINOPHEN EXTRA STRENGTH 500 MG PO TABS: 1000.0000 mg | ORAL_TABLET | Freq: Four times a day (QID) | ORAL | 0 refills | Status: AC

## 2023-09-19 SURGICAL SUPPLY — 23 items
ABLATOR SURESOUND NOVASURE (ABLATOR) ×1 IMPLANT
BASIN KIT SINGLE STR (MISCELLANEOUS) ×1 IMPLANT
DRSG TELFA 3X8 NADH STRL (GAUZE/BANDAGES/DRESSINGS) ×1 IMPLANT
GLOVE BIO SURGEON STRL SZ7 (GLOVE) ×1 IMPLANT
GLOVE INDICATOR 7.5 STRL GRN (GLOVE) ×1 IMPLANT
GOWN STRL REUS W/ TWL LRG LVL3 (GOWN DISPOSABLE) ×2 IMPLANT
KIT PROCEDURE FLUENT (KITS) ×1 IMPLANT
KIT TURNOVER CYSTO (KITS) ×1 IMPLANT
MANIFOLD NEPTUNE II (INSTRUMENTS) ×1 IMPLANT
NDL SPNL 20GX3.5 QUINCKE YW (NEEDLE) IMPLANT
NEEDLE SPNL 20GX3.5 QUINCKE YW (NEEDLE) ×1 IMPLANT
NS IRRIG 500ML POUR BTL (IV SOLUTION) ×1 IMPLANT
PACK DNC HYST (MISCELLANEOUS) ×1 IMPLANT
PAD OB MATERNITY 11 LF (PERSONAL CARE ITEMS) ×1 IMPLANT
PAD PREP OB/GYN DISP 24X41 (PERSONAL CARE ITEMS) ×1 IMPLANT
SCRUB CHG 4% DYNA-HEX 4OZ (MISCELLANEOUS) ×1 IMPLANT
SEAL ROD LENS SCOPE MYOSURE (ABLATOR) ×1 IMPLANT
SOL .9 NS 3000ML IRR UROMATIC (IV SOLUTION) ×1 IMPLANT
SOL PREP PVP 2OZ (MISCELLANEOUS) ×1 IMPLANT
SOLUTION PREP PVP 2OZ (MISCELLANEOUS) ×1 IMPLANT
SYR CONTROL 10ML LL (SYRINGE) IMPLANT
TOWEL OR 17X26 4PK STRL BLUE (TOWEL DISPOSABLE) ×1 IMPLANT
WATER STERILE IRR 500ML POUR (IV SOLUTION) ×1 IMPLANT

## 2023-09-19 NOTE — Anesthesia Preprocedure Evaluation (Signed)
 Anesthesia Evaluation  Patient identified by MRN, date of birth, ID band Patient awake    Reviewed: Allergy & Precautions, NPO status , Patient's Chart, lab work & pertinent test results  History of Anesthesia Complications (+) PONV and history of anesthetic complications  Airway Mallampati: II  TM Distance: >3 FB Neck ROM: full    Dental  (+) Teeth Intact   Pulmonary neg pulmonary ROS   Pulmonary exam normal        Cardiovascular Exercise Tolerance: Good negative cardio ROS Normal cardiovascular exam Rhythm:Regular Rate:Normal     Neuro/Psych   Anxiety     negative neurological ROS  negative psych ROS   GI/Hepatic negative GI ROS, Neg liver ROS,,,  Endo/Other  negative endocrine ROS    Renal/GU negative Renal ROS  negative genitourinary   Musculoskeletal   Abdominal Normal abdominal exam  (+)   Peds negative pediatric ROS (+)  Hematology negative hematology ROS (+)   Anesthesia Other Findings Past Medical History: No date: Anemia No date: Anxiety No date: Complication of anesthesia     Comment:  shaking/ headache with spinal No date: Indigestion No date: PONV (postoperative nausea and vomiting) No date: Thyroid nodule  Past Surgical History: No date: CESAREAN SECTION 04/04/2015: CESAREAN SECTION; N/A     Comment:  Procedure: CESAREAN SECTION;  Surgeon: Vena Austria,              MD;  Location: [facility] ORS;  Service: Obstetrics;                Laterality: N/A; 01/30/2017: CESAREAN SECTION; N/A     Comment:  Procedure: CESAREAN SECTION;  Surgeon: Vena Austria, MD;  Location: [facility] ORS;  Service: Obstetrics;                Laterality: N/A; 01/30/2017: TUBAL LIGATION     Reproductive/Obstetrics negative OB ROS                              Anesthesia Physical Anesthesia Plan  ASA: 2  Anesthesia Plan: General   Post-op Pain Management:    Induction:  Intravenous  PONV Risk Score and Plan: 1 and Ondansetron and Dexamethasone  Airway Management Planned: Natural Airway and Nasal Cannula  Additional Equipment:   Intra-op Plan:   Post-operative Plan:   Informed Consent: I have reviewed the patients History and Physical, chart, labs and discussed the procedure including the risks, benefits and alternatives for the proposed anesthesia with the patient or authorized representative who has indicated his/her understanding and acceptance.     Dental Advisory Given  Plan Discussed with: CRNA  Anesthesia Plan Comments:          Anesthesia Quick Evaluation

## 2023-09-19 NOTE — Interval H&P Note (Signed)
 History and Physical Interval Note:  09/19/2023 12:53 PM  Kimberly Hanna  has presented today for surgery, with the diagnosis of abnormal uterine bleeding.  The various methods of treatment have been discussed with the patient and family. After consideration of risks, benefits and other options for treatment, the patient has consented to  Procedure(s) with comments: ABLATION, ENDOMETRIUM, HYSTEROSCOPIC (N/A) - NOVASURE as a surgical intervention.  The patient's history has been reviewed, patient examined, no change in status, stable for surgery.  I have reviewed the patient's chart and labs.  Questions were answered to the patient's satisfaction.     Christeen Douglas

## 2023-09-19 NOTE — Op Note (Signed)
 Operative Report Hysteroscopy with Dilation and Curettage; Novasure ablation   Indications: Menorrhagia   Pre-operative Diagnosis: AUB   Post-operative Diagnosis: same.  Procedure: 1. Exam under anesthesia 2. Fractional D&C with endocervical curettage  3. Hysteroscopy 4. Novasure endometrial ablation  Surgeon: Cline Cools, MD  Assistant(s):  None  Anesthesia: Monitored Local Anesthesia with Sedation  Anesthesiologist: Darleene Cleaver, Gerrit Heck, MD Anesthesiologist: Darleene Cleaver, Gerrit Heck, MD CRNA: Lanell Matar, CRNA; Jaye Beagle, CRNA  Estimated Blood Loss:  Minimal         Intraoperative medications:  Toradol         Total IV Fluids:  Urine Output:         Specimens: Endocervical curettings, endometrial curettings         Complications:  None; patient tolerated the procedure well.         Disposition: PACU - hemodynamically stable.         Condition: stable  Findings: Uterus measuring 8.5 cm by sound; normal cervix, vagina, perineum. Cervical length: 4.5 cm Uterine cavity length: 4 cm Uterine cavity width: 3.7 cm Power in watts: 81 Total time: 120 sec  No cavity disruption, anterior wall smooth. Significant fluffy endometrial tissue throughout.  Indication for procedure/Consents: 38 y.o. [id number]  here for scheduled surgery for the aforementioned diagnoses.  Risks of surgery were discussed with the patient including but not limited to: bleeding which may require transfusion; infection which may require antibiotics; injury to uterus or surrounding organs; intrauterine scarring which may impair future fertility; need for additional procedures including laparotomy or laparoscopy; and other postoperative/anesthesia complications. Written informed consent was obtained.    Procedure Details:   The patient was taken to the operating room where MAC anesthesia was administered and was found to be adequate.  After a formal and adequate  timeout was performed, she was placed in the dorsal lithotomy position and examined with the above findings. She was then prepped and draped in the sterile manner.   Her bladder was catheterized for an estimated amount of clear, yellow urine. A bivalve speculum was then placed in the patient's vagina and a single tooth tenaculum was applied to the anterior lip of the cervix.  An endocervical currettage was performed. Her cervix was serially dilated to 15 [place] using Hanks dilators. The hysteroscope was introduced to reveal the above findings.The hysteroscope was also used to determine the level of the internal os, and measurements were confirmed. The uterine cavity was carefully examined, both ostia were recognized, and diffusely proliferative endometrium with polypoid fragments was noted.  A sharp curettage was then performed until there was a gritty texture in all four quadrants.    NOVASURE PROCEDURE DETAILS:   The cervix was further dilated to accommodate the NovaSure device.  The NovaSure device was inserted, and the cavity width was determined. Using a power of 81 watts, for 120 sec, the endometrial ablation was performed. The tenaculum was removed from the anterior lip of the cervix, and the vaginal speculum was removed after noting good hemostasis.  She received iv acetaminophen and Toradol prior to leaving the OR. The patient tolerated the procedure well and was taken to the recovery area awake, extubated and in stable condition.  The patient will be discharged to home as per PACU criteria.  Routine postoperative instructions given.  She was prescribed Percocet, Ibuprofen and Colace.  She will follow up in the clinic in two weeks for postoperative evaluation.  I think it likely that  her cycles will be less heavy, but at the close of this procedure I did look again with the hysteroscope to be certain no evidence of c/s scar disruption, and a mild amount of proliferatve tissue appeared to remain.  All areas of the uterus had a dark grey appearance. IUD might be possible in the future as well.

## 2023-09-19 NOTE — Transfer of Care (Signed)
 Immediate Anesthesia Transfer of Care Note  Patient: Kimberly Hanna  Procedure(s) Performed: ABLATION, ENDOMETRIUM, HYSTEROSCOPIC, dilation and curettage (Uterus)  Patient Location: PACU  Anesthesia Type:General  Level of Consciousness: drowsy  Airway & Oxygen Therapy: Patient Spontanous Breathing and Patient connected to face mask oxygen  Post-op Assessment: Report given to RN  Post vital signs: stable  Last Vitals:  Vitals Value Taken Time  BP 90/40 09/19/23 1507  Temp 36.3 C 09/19/23 1507  Pulse 83 09/19/23 1510  Resp 25 09/19/23 1510  SpO2 100 % 09/19/23 1510  Vitals shown include unfiled device data.  Last Pain:  Vitals:   09/19/23 1159  TempSrc:   PainSc: 0-No pain         Complications: No notable events documented.

## 2023-09-19 NOTE — Discharge Instructions (Signed)
 Discharge instructions after a hysteroscopy with dilation and curettage  Signs and Symptoms to Report  Call our office at [phone] if you have any of the following:    Fever over 100.4 degrees or higher  Severe stomach pain not relieved with pain medications  Bright red bleeding that's heavier than a period that does not slow with rest after the first 24 hours  To go the bathroom a lot (frequency), you can't hold your urine (urgency), or it hurts when you empty your bladder (urinate)  Chest pain  Shortness of breath  Pain in the calves of your legs  Severe nausea and vomiting not relieved with anti-nausea medications  Any concerns  What You Can Expect after Surgery  You may see some pink tinged, bloody fluid. This is normal. You may also have cramping for several days.   Activities after Your Discharge Follow these guidelines to help speed your recovery at home:  Don't drive if you are in pain or taking narcotic pain medicine. You may drive when you can safely slam on the brakes, turn the wheel forcefully, and rotate your torso comfortably. This is typically 4-7 days. Practice in a parking lot or side street prior to attempting to drive regularly.   Ask others to help with household chores for 4 weeks.  Don't do strenuous activities, exercises, or sports like vacuuming, tennis, squash, etc. until your doctor says it is safe to do so.  Walk as you feel able. Rest often since it may take a week or two for your energy level to return to normal.   You may climb stairs  Avoid constipation:   -Eat fruits, vegetables, and whole grains. Eat small meals as your appetite will take time to return to normal.   -Drink 6 to 8 glasses of water each day unless your doctor has told you to limit your fluids.   -Use a laxative or stool softener as needed if constipation becomes a problem. You may take Miralax, metamucil, Citrucil, Colace, Senekot, FiberCon, etc. If this does not relieve the  constipation, try two tablespoons of Milk Of Magnesia every 8 hours until your bowels move.   You may shower.   Do not get in a hot tub, swimming pool, etc. until your doctor agrees.  Do not douche, use tampons, or have sex until your doctor says it is okay, usually about 2 weeks.  Take your pain medicine when you need it. The medicine may not work as well if the pain is bad.  Take the medicines you were taking before surgery. Other medications you might need are pain medications (ibuprofen), medications for constipation (Colace) and nausea medications (Zofran).

## 2023-09-22 ENCOUNTER — Encounter: Payer: Self-pay | Admitting: Obstetrics and Gynecology

## 2023-09-22 LAB — SURGICAL PATHOLOGY

## 2023-10-02 NOTE — Anesthesia Postprocedure Evaluation (Signed)
 Anesthesia Post Note  Patient: Kimberly Hanna  Procedure(s) Performed: ABLATION, ENDOMETRIUM, HYSTEROSCOPIC, dilation and curettage (Uterus)  Patient location during evaluation: PACU Anesthesia Type: General Level of consciousness: awake and awake and alert Pain management: satisfactory to patient Vital Signs Assessment: post-procedure vital signs reviewed and stable Respiratory status: nonlabored ventilation Cardiovascular status: blood pressure returned to baseline Anesthetic complications: no   No notable events documented.   Last Vitals:  Vitals:   09/19/23 1557 09/19/23 1613  BP: (!) 119/91 115/82  Pulse: 78 72  Resp: 13 14  Temp: (!) 36.3 C 36.8 C  SpO2: 100% 100%    Last Pain:  Vitals:   09/21/23 1358  TempSrc:   PainSc: 2                  VAN STAVEREN,Fatemah Pourciau

## 2023-10-08 ENCOUNTER — Encounter: Payer: Self-pay | Admitting: Obstetrics and Gynecology

## 2023-10-08 ENCOUNTER — Other Ambulatory Visit: Payer: Self-pay | Admitting: Obstetrics and Gynecology

## 2023-10-08 DIAGNOSIS — R319 Hematuria, unspecified: Secondary | ICD-10-CM

## 2023-10-08 DIAGNOSIS — R350 Frequency of micturition: Secondary | ICD-10-CM

## 2023-10-08 DIAGNOSIS — Z9889 Other specified postprocedural states: Secondary | ICD-10-CM
